# Patient Record
Sex: Male | Born: 1970 | Race: Black or African American | Hispanic: No | Marital: Single | Smoking: Current every day smoker
Health system: Southern US, Community
[De-identification: ages and names within clinical notes are randomized; demographics above are authoritative.]

## PROBLEM LIST (undated history)

## (undated) DIAGNOSIS — L259 Unspecified contact dermatitis, unspecified cause: Secondary | ICD-10-CM

## (undated) DIAGNOSIS — M1990 Unspecified osteoarthritis, unspecified site: Secondary | ICD-10-CM

## (undated) HISTORY — DX: Unspecified contact dermatitis, unspecified cause: L25.9

## (undated) HISTORY — PX: HERNIA REPAIR: SHX51

---

## 2008-10-05 ENCOUNTER — Emergency Department (HOSPITAL_COMMUNITY): Admission: EM | Admit: 2008-10-05 | Discharge: 2008-10-06 | Payer: Self-pay | Admitting: Emergency Medicine

## 2008-10-13 ENCOUNTER — Emergency Department (HOSPITAL_COMMUNITY): Admission: EM | Admit: 2008-10-13 | Discharge: 2008-10-13 | Payer: Self-pay | Admitting: Emergency Medicine

## 2009-01-15 ENCOUNTER — Emergency Department (HOSPITAL_COMMUNITY): Admission: EM | Admit: 2009-01-15 | Discharge: 2009-01-15 | Payer: Self-pay | Admitting: Emergency Medicine

## 2009-03-10 ENCOUNTER — Emergency Department (HOSPITAL_COMMUNITY): Admission: EM | Admit: 2009-03-10 | Discharge: 2009-03-10 | Payer: Self-pay | Admitting: Emergency Medicine

## 2009-05-22 ENCOUNTER — Emergency Department (HOSPITAL_COMMUNITY): Admission: EM | Admit: 2009-05-22 | Discharge: 2009-05-22 | Payer: Self-pay | Admitting: Emergency Medicine

## 2009-07-22 ENCOUNTER — Emergency Department (HOSPITAL_COMMUNITY): Admission: EM | Admit: 2009-07-22 | Discharge: 2009-07-22 | Payer: Self-pay | Admitting: Emergency Medicine

## 2009-11-27 ENCOUNTER — Ambulatory Visit: Payer: Self-pay | Admitting: Family Medicine

## 2009-12-07 ENCOUNTER — Ambulatory Visit (HOSPITAL_COMMUNITY): Admission: RE | Admit: 2009-12-07 | Discharge: 2009-12-07 | Payer: Self-pay | Admitting: Family Medicine

## 2009-12-08 ENCOUNTER — Encounter (INDEPENDENT_AMBULATORY_CARE_PROVIDER_SITE_OTHER): Payer: Self-pay | Admitting: Family Medicine

## 2009-12-08 ENCOUNTER — Ambulatory Visit: Payer: Self-pay | Admitting: Internal Medicine

## 2009-12-08 LAB — CONVERTED CEMR LAB
ALT: 16 units/L (ref 0–53)
AST: 27 units/L (ref 0–37)
Albumin: 4.6 g/dL (ref 3.5–5.2)
Alkaline Phosphatase: 49 units/L (ref 39–117)
BUN: 13 mg/dL (ref 6–23)
Basophils Absolute: 0 10*3/uL (ref 0.0–0.1)
Basophils Relative: 0 % (ref 0–1)
CO2: 25 meq/L (ref 19–32)
Calcium: 9.5 mg/dL (ref 8.4–10.5)
Chloride: 102 meq/L (ref 96–112)
Cholesterol: 187 mg/dL (ref 0–200)
Creatinine, Ser: 1.46 mg/dL (ref 0.40–1.50)
Eosinophils Absolute: 0.2 10*3/uL (ref 0.0–0.7)
Eosinophils Relative: 1 % (ref 0–5)
Glucose, Bld: 95 mg/dL (ref 70–99)
HCT: 43.2 % (ref 39.0–52.0)
HDL: 87 mg/dL (ref >39)
Hemoglobin: 13.8 g/dL (ref 13.0–17.0)
LDL Cholesterol: 92 mg/dL (ref 0–99)
Lymphocytes Relative: 16 % (ref 12–46)
Lymphs Abs: 2.7 10*3/uL (ref 0.7–4.0)
MCHC: 31.9 g/dL (ref 30.0–36.0)
MCV: 100.7 fL — ABNORMAL HIGH (ref 78.0–100.0)
Monocytes Absolute: 0.9 10*3/uL (ref 0.1–1.0)
Monocytes Relative: 5 % (ref 3–12)
Neutro Abs: 13 10*3/uL — ABNORMAL HIGH (ref 1.7–7.7)
Neutrophils Relative %: 77 % (ref 43–77)
Platelets: 120 10*3/uL — ABNORMAL LOW (ref 150–400)
Potassium: 4.3 meq/L (ref 3.5–5.3)
RBC: 4.29 M/uL (ref 4.22–5.81)
RDW: 12.9 % (ref 11.5–15.5)
Sed Rate: 1 mm/hr (ref 0–16)
Sodium: 138 meq/L (ref 135–145)
Total Bilirubin: 1 mg/dL (ref 0.3–1.2)
Total CHOL/HDL Ratio: 2.1
Total Protein: 6.9 g/dL (ref 6.0–8.3)
Triglycerides: 41 mg/dL (ref <150)
VLDL: 8 mg/dL (ref 0–40)
WBC: 16.8 10*3/uL — ABNORMAL HIGH (ref 4.0–10.5)

## 2009-12-19 ENCOUNTER — Ambulatory Visit: Payer: Self-pay | Admitting: Oncology

## 2010-01-11 LAB — MORPHOLOGY: PLT EST: ADEQUATE

## 2010-01-11 LAB — CBC WITH DIFFERENTIAL/PLATELET
BASO%: 0.4 % (ref 0.0–2.0)
Basophils Absolute: 0 10*3/uL (ref 0.0–0.1)
EOS%: 2.8 % (ref 0.0–7.0)
Eosinophils Absolute: 0.3 10*3/uL (ref 0.0–0.5)
HCT: 40.3 % (ref 38.4–49.9)
HGB: 13.4 g/dL (ref 13.0–17.1)
LYMPH%: 26 % (ref 14.0–49.0)
MCH: 32.8 pg (ref 27.2–33.4)
MCHC: 33.3 g/dL (ref 32.0–36.0)
MCV: 98.5 fL — ABNORMAL HIGH (ref 79.3–98.0)
MONO#: 0.7 10*3/uL (ref 0.1–0.9)
MONO%: 7.2 % (ref 0.0–14.0)
NEUT#: 5.9 10*3/uL (ref 1.5–6.5)
NEUT%: 63.6 % (ref 39.0–75.0)
Platelets: 200 10*3/uL (ref 140–400)
RBC: 4.09 10*6/uL — ABNORMAL LOW (ref 4.20–5.82)
RDW: 12.6 % (ref 11.0–14.6)
WBC: 9.3 10*3/uL (ref 4.0–10.3)
lymph#: 2.4 10*3/uL (ref 0.9–3.3)
nRBC: 0 % (ref 0–0)

## 2010-01-11 LAB — CHCC SMEAR

## 2010-01-11 LAB — HIV ANTIBODY (ROUTINE TESTING W REFLEX)

## 2010-01-12 LAB — SEDIMENTATION RATE: Sed Rate: 2 mm/hr (ref 0–16)

## 2010-01-12 LAB — COMPREHENSIVE METABOLIC PANEL
ALT: 13 U/L (ref 0–53)
AST: 22 U/L (ref 0–37)
Albumin: 4.6 g/dL (ref 3.5–5.2)
Alkaline Phosphatase: 50 U/L (ref 39–117)
BUN: 16 mg/dL (ref 6–23)
CO2: 22 mEq/L (ref 19–32)
Calcium: 9.5 mg/dL (ref 8.4–10.5)
Chloride: 105 mEq/L (ref 96–112)
Creatinine, Ser: 1.42 mg/dL (ref 0.40–1.50)
Glucose, Bld: 95 mg/dL (ref 70–99)
Potassium: 4.6 mEq/L (ref 3.5–5.3)
Sodium: 137 mEq/L (ref 135–145)
Total Bilirubin: 0.4 mg/dL (ref 0.3–1.2)
Total Protein: 6.9 g/dL (ref 6.0–8.3)

## 2010-01-12 LAB — LACTATE DEHYDROGENASE: LDH: 138 U/L (ref 94–250)

## 2010-01-12 LAB — ANA: Anti Nuclear Antibody(ANA): NEGATIVE

## 2010-01-31 ENCOUNTER — Ambulatory Visit: Payer: Self-pay | Admitting: Oncology

## 2010-02-01 ENCOUNTER — Ambulatory Visit (HOSPITAL_COMMUNITY): Admission: RE | Admit: 2010-02-01 | Discharge: 2010-02-01 | Payer: Self-pay | Admitting: Oncology

## 2010-02-23 LAB — CBC WITH DIFFERENTIAL/PLATELET
BASO%: 0.2 % (ref 0.0–2.0)
Basophils Absolute: 0 10*3/uL (ref 0.0–0.1)
EOS%: 4.8 % (ref 0.0–7.0)
Eosinophils Absolute: 0.5 10*3/uL (ref 0.0–0.5)
HCT: 37 % — ABNORMAL LOW (ref 38.4–49.9)
HGB: 12.4 g/dL — ABNORMAL LOW (ref 13.0–17.1)
LYMPH%: 27.8 % (ref 14.0–49.0)
MCH: 32.3 pg (ref 27.2–33.4)
MCHC: 33.5 g/dL (ref 32.0–36.0)
MCV: 96.4 fL (ref 79.3–98.0)
MONO#: 0.7 10*3/uL (ref 0.1–0.9)
MONO%: 7 % (ref 0.0–14.0)
NEUT#: 6 10*3/uL (ref 1.5–6.5)
NEUT%: 60.2 % (ref 39.0–75.0)
Platelets: 124 10*3/uL — ABNORMAL LOW (ref 140–400)
RBC: 3.84 10*6/uL — ABNORMAL LOW (ref 4.20–5.82)
RDW: 12.4 % (ref 11.0–14.6)
WBC: 9.9 10*3/uL (ref 4.0–10.3)
lymph#: 2.7 10*3/uL (ref 0.9–3.3)
nRBC: 0 % (ref 0–0)

## 2010-02-23 LAB — COMPREHENSIVE METABOLIC PANEL
ALT: 59 U/L — ABNORMAL HIGH (ref 0–53)
AST: 215 U/L — ABNORMAL HIGH (ref 0–37)
Albumin: 4.4 g/dL (ref 3.5–5.2)
Alkaline Phosphatase: 43 U/L (ref 39–117)
BUN: 15 mg/dL (ref 6–23)
CO2: 27 mEq/L (ref 19–32)
Calcium: 9.3 mg/dL (ref 8.4–10.5)
Chloride: 102 mEq/L (ref 96–112)
Creatinine, Ser: 1.12 mg/dL (ref 0.40–1.50)
Glucose, Bld: 112 mg/dL — ABNORMAL HIGH (ref 70–99)
Potassium: 5.5 mEq/L — ABNORMAL HIGH (ref 3.5–5.3)
Sodium: 139 mEq/L (ref 135–145)
Total Bilirubin: 0.4 mg/dL (ref 0.3–1.2)
Total Protein: 6.8 g/dL (ref 6.0–8.3)

## 2010-02-23 LAB — LACTATE DEHYDROGENASE: LDH: 873 U/L — ABNORMAL HIGH (ref 94–250)

## 2010-10-21 ENCOUNTER — Encounter: Payer: Self-pay | Admitting: Family Medicine

## 2011-08-23 ENCOUNTER — Emergency Department (INDEPENDENT_AMBULATORY_CARE_PROVIDER_SITE_OTHER)
Admission: EM | Admit: 2011-08-23 | Discharge: 2011-08-23 | Disposition: A | Discharge status: Home / Self Care | Payer: Self-pay | Source: Home / Self Care | Attending: Family Medicine | Admitting: Family Medicine

## 2011-08-23 DIAGNOSIS — Z202 Contact with and (suspected) exposure to infections with a predominantly sexual mode of transmission: Secondary | ICD-10-CM

## 2011-08-23 LAB — RPR: RPR Ser Ql: NONREACTIVE

## 2011-08-23 LAB — HIV ANTIBODY (ROUTINE TESTING W REFLEX): HIV: NONREACTIVE

## 2011-08-23 MED ORDER — AZITHROMYCIN 250 MG PO TABS
ORAL_TABLET | ORAL | Status: AC
  Filled 2011-08-23: qty 4

## 2011-08-23 MED ORDER — CEFTRIAXONE SODIUM 250 MG IJ SOLR
INTRAMUSCULAR | Status: AC
  Filled 2011-08-23: qty 250

## 2011-08-23 MED ORDER — LIDOCAINE HCL (PF) 1 % IJ SOLN
INTRAMUSCULAR | Status: AC
  Filled 2011-08-23: qty 5

## 2011-08-23 MED ORDER — AZITHROMYCIN 250 MG PO TABS
1000.0000 mg | ORAL_TABLET | Freq: Once | ORAL | Status: AC
  Administered 2011-08-23: 1000 mg via ORAL

## 2011-08-23 MED ORDER — CEFTRIAXONE SODIUM 250 MG IJ SOLR
250.0000 mg | Freq: Once | INTRAMUSCULAR | Status: AC
  Administered 2011-08-23: 250 mg via INTRAMUSCULAR

## 2011-08-23 NOTE — ED Provider Notes (Signed)
Medical screening examination/treatment/procedure(s) were performed by non-physician practitioner and as supervising physician I was immediately available for consultation/collaboration.  Corrie Mckusick, MD 08/23/11 1630

## 2011-08-23 NOTE — ED Notes (Signed)
States he  Was informed by partner that she has chlaymidia; NAD; want to be tested for all STD's

## 2011-08-23 NOTE — ED Provider Notes (Signed)
History     CSN: [id number] Arrival date & time: 08/23/2011 10:33 AM   First MD Initiated Contact with Patient 08/23/11 1020      Chief Complaint  Patient presents with  . Exposure to STD    (Consider location/radiation/quality/duration/timing/severity/associated sxs/prior treatment) HPI Comments: Pt reports his fiance recently dx with chlamydia. Wants tx and testing for STDs.  Denies any sx or c/o  Patient is a 40 y.o. male presenting with STD exposure. The history is provided by the patient.  Exposure to STD This is a new problem. The problem has not changed since onset.Pertinent negatives include no abdominal pain. The symptoms are aggravated by nothing.    History reviewed. No pertinent past medical history.  History reviewed. No pertinent past surgical history.  History reviewed. No pertinent family history.  History  Substance Use Topics  . Smoking status: Not on file  . Smokeless tobacco: Not on file  . Alcohol Use: Not on file      Review of Systems  Constitutional: Negative for fever and chills.  Gastrointestinal: Negative for abdominal pain.  Genitourinary: Negative for flank pain, discharge, difficulty urinating, genital sores and penile pain.    Allergies  Review of patient's allergies indicates no known allergies.  Home Medications  No current outpatient prescriptions on file.  BP 133/68  Pulse 78  Temp(Src) 98.4 F (36.9 C) (Oral)  Resp 10  Physical Exam  Constitutional: He appears well-developed and well-nourished.  Cardiovascular: Normal rate and regular rhythm.   Pulmonary/Chest: Effort normal and breath sounds normal.  Abdominal: Soft. Bowel sounds are normal. He exhibits no distension. There is no tenderness.  Genitourinary:       GU exam deferred as pt denies sx and will be receiving tx today for GC and chlamydia    ED Course  Procedures (including critical care time)   Labs Reviewed  RPR  HIV ANTIBODY (ROUTINE TESTING)   No  results found.   No diagnosis found.    MDM  Deferred GU exam since will be tx for GC and chlamydia today.  Pt knows to avoid intercourse or use condoms for 7 days after tx to prevent recurrence.         Cathlyn Parsons, NP 08/23/11 1121

## 2011-08-26 ENCOUNTER — Telehealth (HOSPITAL_COMMUNITY): Payer: Self-pay | Admitting: *Deleted

## 2011-08-26 NOTE — ED Notes (Signed)
Pt came in today with picture ID to get HIV results.  Pt given negative results for HIV and RPR.  Pt did not have any questions, wanted copy of report.  Had patient speak with Medical Records clerk, Maxine Glenn and she is getting that for him.

## 2011-09-04 ENCOUNTER — Encounter (HOSPITAL_COMMUNITY): Payer: Self-pay | Admitting: Emergency Medicine

## 2011-09-04 ENCOUNTER — Emergency Department (HOSPITAL_COMMUNITY)
Admission: EM | Admit: 2011-09-04 | Discharge: 2011-09-05 | Disposition: A | Discharge status: Home / Self Care | Payer: Self-pay | Attending: Emergency Medicine | Admitting: Emergency Medicine

## 2011-09-04 DIAGNOSIS — F1910 Other psychoactive substance abuse, uncomplicated: Secondary | ICD-10-CM | POA: Insufficient documentation

## 2011-09-04 DIAGNOSIS — F17200 Nicotine dependence, unspecified, uncomplicated: Secondary | ICD-10-CM | POA: Insufficient documentation

## 2011-09-04 DIAGNOSIS — F1010 Alcohol abuse, uncomplicated: Secondary | ICD-10-CM | POA: Insufficient documentation

## 2011-09-04 HISTORY — DX: Unspecified osteoarthritis, unspecified site: M19.90

## 2011-09-04 LAB — DIFFERENTIAL
Basophils Absolute: 0 10*3/uL (ref 0.0–0.1)
Basophils Relative: 0 % (ref 0–1)
Eosinophils Absolute: 0.2 10*3/uL (ref 0.0–0.7)
Eosinophils Relative: 2 % (ref 0–5)
Lymphocytes Relative: 37 % (ref 12–46)
Lymphs Abs: 4.3 10*3/uL — ABNORMAL HIGH (ref 0.7–4.0)
Monocytes Absolute: 0.9 10*3/uL (ref 0.1–1.0)
Monocytes Relative: 8 % (ref 3–12)
Neutro Abs: 6.2 10*3/uL (ref 1.7–7.7)
Neutrophils Relative %: 53 % (ref 43–77)

## 2011-09-04 LAB — CBC
HCT: 39.5 % (ref 39.0–52.0)
Hemoglobin: 13.2 g/dL (ref 13.0–17.0)
MCH: 32.4 pg (ref 26.0–34.0)
MCHC: 33.4 g/dL (ref 30.0–36.0)
MCV: 96.8 fL (ref 78.0–100.0)
Platelets: 162 10*3/uL (ref 150–400)
RBC: 4.08 MIL/uL — ABNORMAL LOW (ref 4.22–5.81)
RDW: 13 % (ref 11.5–15.5)
WBC: 11.6 10*3/uL — ABNORMAL HIGH (ref 4.0–10.5)

## 2011-09-04 LAB — BASIC METABOLIC PANEL
BUN: 15 mg/dL (ref 6–23)
CO2: 27 mEq/L (ref 19–32)
Calcium: 9.2 mg/dL (ref 8.4–10.5)
Chloride: 100 mEq/L (ref 96–112)
Creatinine, Ser: 1.17 mg/dL (ref 0.50–1.35)
GFR calc Af Amer: 88 mL/min — ABNORMAL LOW (ref >90)
GFR calc non Af Amer: 76 mL/min — ABNORMAL LOW (ref >90)
Glucose, Bld: 90 mg/dL (ref 70–99)
Potassium: 3.9 mEq/L (ref 3.5–5.1)
Sodium: 135 mEq/L (ref 135–145)

## 2011-09-04 LAB — RAPID URINE DRUG SCREEN, HOSP PERFORMED
Amphetamines: NOT DETECTED
Barbiturates: NOT DETECTED
Benzodiazepines: NOT DETECTED
Cocaine: POSITIVE — AB
Opiates: NOT DETECTED
Tetrahydrocannabinol: POSITIVE — AB

## 2011-09-04 LAB — ETHANOL: Alcohol, Ethyl (B): <11 mg/dL (ref 0–11)

## 2011-09-04 MED ORDER — ACETAMINOPHEN 325 MG PO TABS: 650.0000 mg | ORAL_TABLET | ORAL | Status: DC | PRN

## 2011-09-04 MED ORDER — ALUM & MAG HYDROXIDE-SIMETH 200-200-20 MG/5ML PO SUSP: 30.0000 mL | ORAL | Status: DC | PRN

## 2011-09-04 MED ORDER — ZOLPIDEM TARTRATE 5 MG PO TABS: 5.0000 mg | ORAL_TABLET | Freq: Every evening | ORAL | Status: DC | PRN

## 2011-09-04 MED ORDER — NICOTINE 21 MG/24HR TD PT24: 21.0000 mg | MEDICATED_PATCH | Freq: Every day | TRANSDERMAL | Status: DC

## 2011-09-04 MED ORDER — ONDANSETRON HCL 4 MG PO TABS: 4.0000 mg | ORAL_TABLET | Freq: Three times a day (TID) | ORAL | Status: DC | PRN

## 2011-09-04 MED ORDER — LORAZEPAM 1 MG PO TABS: 1.0000 mg | ORAL_TABLET | Freq: Three times a day (TID) | ORAL | Status: DC | PRN

## 2011-09-04 MED ORDER — IBUPROFEN 600 MG PO TABS: 600.0000 mg | ORAL_TABLET | Freq: Three times a day (TID) | ORAL | Status: DC | PRN

## 2011-09-04 NOTE — ED Provider Notes (Signed)
History     CSN: [id number] Arrival date & time: 09/04/2011  3:13 PM   First MD Initiated Contact with Patient 09/04/11 1720      Chief Complaint  Patient presents with  . Medical Clearance    (Consider location/radiation/quality/duration/timing/severity/associated sxs/prior treatment) HPI  Patient presents to ER requesting detox from alcohol, crack cocaine, marijuana stating that he has been using all three for approximately 25 years with a 10 year hx of drinking approximately 8-9 forty ounce beers a day. Patient has not attempted to quit substances on his own in the past and therefore no hx of alcohol withdrawal seizures or DTs. Patient has no physical complaint at this time. States he feels mild depression at times but denies SI or HI ideation. Denies aggravating or alleviating factors.   Past Medical History  Diagnosis Date  . Arthritis     History reviewed. No pertinent past surgical history.  No family history on file.  History  Substance Use Topics  . Smoking status: Current Everyday Smoker -- 10 years    Types: Cigarettes  . Smokeless tobacco: Not on file  . Alcohol Use:      multiple drinks daily       Review of Systems  All other systems reviewed and are negative.    Allergies  Review of patient's allergies indicates no known allergies.  Home Medications  No current outpatient prescriptions on file.  BP 132/66  Pulse 88  Temp(Src) 98.6 F (37 C) (Oral)  Resp 18  SpO2 99%  Physical Exam  Nursing note and vitals reviewed. Constitutional: He is oriented to person, place, and time. He appears well-developed and well-nourished. No distress.  HENT:  Head: Normocephalic and atraumatic.  Eyes: Conjunctivae and EOM are normal. Pupils are equal, round, and reactive to light.  Neck: Normal range of motion. Neck supple.  Cardiovascular: Normal rate, regular rhythm, normal heart sounds and intact distal pulses.  Exam reveals no gallop and no friction rub.     No murmur heard. Pulmonary/Chest: Effort normal and breath sounds normal. No respiratory distress. He has no wheezes. He has no rales. He exhibits no tenderness.  Abdominal: Bowel sounds are normal. He exhibits no distension and no mass. There is no tenderness. There is no rebound and no guarding.  Musculoskeletal: Normal range of motion. He exhibits no edema and no tenderness.  Neurological: He is alert and oriented to person, place, and time.  Skin: Skin is warm and dry. No rash noted. He is not diaphoretic. No erythema.  Psychiatric: He has a normal mood and affect.    ED Course  Procedures (including critical care time)  pysch holding orders written.   Pending ACT team consult.  Labs Reviewed  BASIC METABOLIC PANEL - Abnormal; Notable for the following:    GFR calc non Af Amer 76 (*)    GFR calc Af Amer 88 (*)    All other components within normal limits  CBC - Abnormal; Notable for the following:    WBC 11.6 (*)    RBC 4.08 (*)    All other components within normal limits  DIFFERENTIAL - Abnormal; Notable for the following:    Lymphs Abs 4.3 (*)    All other components within normal limits  URINE RAPID DRUG SCREEN (HOSP PERFORMED) - Abnormal; Notable for the following:    Cocaine POSITIVE (*)    Tetrahydrocannabinol POSITIVE (*)    All other components within normal limits  ETHANOL   No results found.  1. Alcohol abuse   2. Drug abuse       MDM          Jenness Corner, [place] 09/04/11 2257

## 2011-09-04 NOTE — ED Notes (Signed)
Pt has been cleared for discharge after signing a no harm contract with the ACT team. Pt states he cannot leave tonight because he lives with his elderly parents (63 and 50) and he will not be able to get them to the door to let him in in the middle of the night. ACT team and MD aware of delay in discharge. Pt remains stable and safe.

## 2011-09-04 NOTE — ED Notes (Signed)
Pt states that he wants detox from drugs and alcohol states that he has been doing cocaine and liquior for many years now, denies any si or hi
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
## 2011-09-04 NOTE — ED Notes (Signed)
Family at bedside. 

## 2011-09-04 NOTE — ED Notes (Signed)
Pt. Belongings sent with wife

## 2011-09-04 NOTE — ED Notes (Addendum)
Pt presents to Psych ED with wife present requesting detox from "alcohol and cocaine". Pt ETOH<11. Presently pt is in his room sleeping, calm and cooperative with questions. Denies needs, remains safe in room.

## 2011-09-04 NOTE — ED Notes (Signed)
Pt requesting ETOH detox, no s/i or h/i expressed, pt calm and cooperative during triage

## 2011-09-05 NOTE — ED Notes (Signed)
Pt. Called parent to pick him up, parent to arrive at 1030.

## 2011-09-05 NOTE — ED Notes (Signed)
Per pt. Request, washcloth, toothpaste, toothbrush given.

## 2011-09-05 NOTE — ED Notes (Signed)
Per night RN report, pt. Wanted to leave this morning not last night r/t parents in their 41's and didn't want to wake them to come and get him, transportation assistance offered and declined by pt, per night  RN.  Plan to d/c pt. After bkft.

## 2011-09-05 NOTE — ED Provider Notes (Signed)
Medical screening examination/treatment/procedure(s) were performed by non-physician practitioner and as supervising physician I was immediately available for consultation/collaboration.   Deforest Maiden A Fredis Malkiewicz, MD 09/05/11 1317 

## 2011-09-20 ENCOUNTER — Encounter (HOSPITAL_COMMUNITY): Payer: Self-pay

## 2011-09-20 ENCOUNTER — Emergency Department (INDEPENDENT_AMBULATORY_CARE_PROVIDER_SITE_OTHER)
Admission: EM | Admit: 2011-09-20 | Discharge: 2011-09-20 | Disposition: A | Discharge status: Home / Self Care | Payer: Self-pay | Source: Home / Self Care | Attending: Emergency Medicine | Admitting: Emergency Medicine

## 2011-09-20 DIAGNOSIS — T300 Burn of unspecified body region, unspecified degree: Secondary | ICD-10-CM

## 2011-09-20 MED ORDER — "TELFA NON-ADHERENT 2""X3"" PADS": 1.0000 "application " | MEDICATED_PAD | Status: DC

## 2011-09-20 MED ORDER — SILVER SULFADIAZINE 1 % EX CREA
TOPICAL_CREAM | Freq: Once | CUTANEOUS | Status: AC
  Administered 2011-09-20: 09:00:00 via TOPICAL

## 2011-09-20 MED ORDER — SILVER SULFADIAZINE 1 % EX CREA: TOPICAL_CREAM | Freq: Every day | CUTANEOUS | Status: AC

## 2011-09-20 MED ORDER — HYDROCODONE-ACETAMINOPHEN 5-325 MG PO TABS: 2.0000 | ORAL_TABLET | ORAL | Status: AC | PRN
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
## 2013-02-24 ENCOUNTER — Emergency Department (HOSPITAL_COMMUNITY): Payer: Self-pay

## 2013-02-24 ENCOUNTER — Emergency Department (HOSPITAL_COMMUNITY)
Admission: EM | Admit: 2013-02-24 | Discharge: 2013-02-24 | Disposition: A | Discharge status: Home / Self Care | Payer: Self-pay | Attending: Emergency Medicine | Admitting: Emergency Medicine

## 2013-02-24 ENCOUNTER — Encounter (HOSPITAL_COMMUNITY): Payer: Self-pay | Admitting: Emergency Medicine

## 2013-02-24 DIAGNOSIS — F17200 Nicotine dependence, unspecified, uncomplicated: Secondary | ICD-10-CM | POA: Insufficient documentation

## 2013-02-24 DIAGNOSIS — M545 Low back pain, unspecified: Secondary | ICD-10-CM | POA: Insufficient documentation

## 2013-02-24 DIAGNOSIS — G8929 Other chronic pain: Secondary | ICD-10-CM | POA: Insufficient documentation

## 2013-02-24 DIAGNOSIS — M549 Dorsalgia, unspecified: Secondary | ICD-10-CM

## 2013-02-24 DIAGNOSIS — Z8739 Personal history of other diseases of the musculoskeletal system and connective tissue: Secondary | ICD-10-CM | POA: Insufficient documentation

## 2013-02-24 MED ORDER — OXYCODONE-ACETAMINOPHEN 5-325 MG PO TABS: 1.0000 | ORAL_TABLET | ORAL | Status: DC | PRN

## 2013-02-24 MED ORDER — METHOCARBAMOL 500 MG PO TABS: 500.0000 mg | ORAL_TABLET | Freq: Two times a day (BID) | ORAL | Status: DC

## 2013-02-24 MED ORDER — IBUPROFEN 800 MG PO TABS: 800.0000 mg | ORAL_TABLET | Freq: Three times a day (TID) | ORAL | Status: DC

## 2013-02-24 MED ORDER — OXYCODONE-ACETAMINOPHEN 5-325 MG PO TABS
1.0000 | ORAL_TABLET | Freq: Once | ORAL | Status: AC
  Administered 2013-02-24: 1 via ORAL
  Filled 2013-02-24: qty 1

## 2013-02-24 MED ORDER — KETOROLAC TROMETHAMINE 60 MG/2ML IM SOLN
60.0000 mg | Freq: Once | INTRAMUSCULAR | Status: AC
  Administered 2013-02-24: 60 mg via INTRAMUSCULAR
  Filled 2013-02-24: qty 2

## 2013-02-24 NOTE — ED Provider Notes (Signed)
History     CSN: [id number]  Arrival date & time 02/24/13  0901   First MD Initiated Contact with Patient 02/24/13 1005      Chief Complaint  Patient presents with  . Back Pain    (Consider location/radiation/quality/duration/timing/severity/associated sxs/prior treatment) HPI Donald Bentley is a 42 y.o. male who presents to ED with complaint of lower back pain. 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 No particular injuries. No pain radiation. Pain is 8/10. Flared up 2 days ago. States pain worse with movement. No fever. No iv drug use. No abdominal pain. No urinary symptoms. No numbness or weakness in extremities. States did not take anything in the last few days, states "over the counter stuff dont work." Does not have PCP.    Past Medical History  Diagnosis Date  . Arthritis     History reviewed. No pertinent past surgical history.  No family history on file.  History  Substance Use Topics  . Smoking status: Current Every Day Smoker -- 10 years    Types: Cigarettes  . Smokeless tobacco: Not on file  . Alcohol Use: Yes     Comment: multiple drinks daily       Review of Systems  Constitutional: Negative for fever and chills.  HENT: Negative for neck pain and neck stiffness.   Respiratory: Negative.   Cardiovascular: Negative.   Gastrointestinal: Negative for abdominal pain.  Genitourinary: Negative for dysuria and flank pain.  Musculoskeletal: Positive for back pain. Negative for gait problem.  Skin: Negative.   Neurological: Negative for weakness and numbness.    Allergies  Review of patient's allergies indicates no known allergies.  Home Medications  No current outpatient prescriptions on file.  BP 120/67  Pulse 82  Temp(Src) 98 F (36.7 C) (Oral)  Resp 18  SpO2 98%  Physical Exam  Nursing note and vitals reviewed. Constitutional: He appears well-developed and well-nourished. No distress.   Eyes: Conjunctivae are normal.  Neck: Neck supple.  Cardiovascular: Normal rate, regular rhythm and normal heart sounds.   Pulmonary/Chest: Effort normal and breath sounds normal. No respiratory distress. He has no wheezes. He has no rales.  Abdominal: Soft. There is no tenderness.  Musculoskeletal:  Midline and perivertebral lumbar spine tenderness. Negative straight leg raise test.   Neurological:  5/5 and equal LE strength bilaterally. 2+ patellar reflexes bilaterally. Pt able to dorsiflex bilateral feet and toes  Skin: Skin is warm and dry.    ED Course  Procedures (including critical care time)  Labs Reviewed - No data to display No results found.  Dg Lumbar Spine Complete  02/24/2013   *RADIOLOGY REPORT*  Clinical Data: Low back pain  LUMBAR SPINE - COMPLETE 4+ VIEW  Comparison: None.  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 Vertebral body height is well-maintained.  No spondylolysis or spondylolisthesis is seen.  Mild disc space narrowing is noted at the lumbosacral junction.  IMPRESSION: Four non-rib bearing lumbar vertebra.  Mild degenerative change.   Original Report Authenticated By: Alcide Clever, M.D.     1. Back pain       MDM  Pt with chronic back pain exacerbation. No red flags suggesting cauda equina. No new injuries. No abdominal pain or neuro deficits. X-ray showing mild degenerative changes, otherwise normal. 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 Given toradol 60mg  IM and percocet 1 tab PO in ED. Home with 20 percocets, 20 robaxin, ibuprofen. Instructed  to follow up with pcp.   Filed Vitals:   02/24/13 0906  BP: 120/67  Pulse: 82  Temp: 98 F (36.7 C)  TempSrc: Oral  Resp: 18  SpO2: 98%           Tarisha Fader A Nickalaus Crooke, PA-C 02/24/13 1157

## 2013-02-24 NOTE — ED Notes (Signed)
Pt c/o low back pain. Denies recent injury, reports ongoing for years. This episode started last week. Pt has not had anything for pain today.

## 2013-02-26 NOTE — ED Provider Notes (Signed)
Medical screening examination/treatment/procedure(s) were performed by non-physician practitioner and as supervising physician I was immediately available for consultation/collaboration.   Lavaeh Bau H Lyal Husted, MD 02/26/13 1504 

## 2015-03-16 ENCOUNTER — Emergency Department (HOSPITAL_COMMUNITY): Payer: Self-pay

## 2015-03-16 ENCOUNTER — Emergency Department (HOSPITAL_COMMUNITY)
Admission: EM | Admit: 2015-03-16 | Discharge: 2015-03-16 | Disposition: A | Discharge status: Home / Self Care | Payer: Self-pay | Attending: Emergency Medicine | Admitting: Emergency Medicine

## 2015-03-16 ENCOUNTER — Encounter (HOSPITAL_COMMUNITY): Payer: Self-pay | Admitting: Emergency Medicine

## 2015-03-16 DIAGNOSIS — Y9289 Other specified places as the place of occurrence of the external cause: Secondary | ICD-10-CM | POA: Insufficient documentation

## 2015-03-16 DIAGNOSIS — M1990 Unspecified osteoarthritis, unspecified site: Secondary | ICD-10-CM | POA: Insufficient documentation

## 2015-03-16 DIAGNOSIS — S0083XA Contusion of other part of head, initial encounter: Secondary | ICD-10-CM

## 2015-03-16 DIAGNOSIS — Z720 Tobacco use: Secondary | ICD-10-CM | POA: Insufficient documentation

## 2015-03-16 DIAGNOSIS — Y9389 Activity, other specified: Secondary | ICD-10-CM | POA: Insufficient documentation

## 2015-03-16 DIAGNOSIS — Y998 Other external cause status: Secondary | ICD-10-CM | POA: Insufficient documentation

## 2015-03-16 DIAGNOSIS — S0511XA Contusion of eyeball and orbital tissues, right eye, initial encounter: Secondary | ICD-10-CM | POA: Insufficient documentation

## 2015-03-16 MED ORDER — IBUPROFEN 800 MG PO TABS: 800.0000 mg | ORAL_TABLET | Freq: Three times a day (TID) | ORAL | Status: DC

## 2015-03-16 MED ORDER — HYDROCODONE-ACETAMINOPHEN 5-325 MG PO TABS
1.0000 | ORAL_TABLET | Freq: Once | ORAL | Status: AC
  Administered 2015-03-16: 1 via ORAL
  Filled 2015-03-16: qty 1

## 2015-03-16 NOTE — Discharge Instructions (Signed)
Contusion °A contusion is a deep bruise. Contusions are the result of an injury that caused bleeding under the skin. The contusion may turn blue, purple, or yellow. Minor injuries will give you a painless contusion, but more severe contusions may stay painful and swollen for a few weeks.  °CAUSES  °A contusion is usually caused by a blow, trauma, or direct force to an area of the body. °SYMPTOMS  °· Swelling and redness of the injured area. °· Bruising of the injured area. °· Tenderness and soreness of the injured area. °· Pain. °DIAGNOSIS  °The diagnosis can be made by taking a history and physical exam. An X-ray, CT scan, or MRI may be needed to determine if there were any associated injuries, such as fractures. °TREATMENT  °Specific treatment will depend on what area of the body was injured. In general, the best treatment for a contusion is resting, icing, elevating, and applying cold compresses to the injured area. Over-the-counter medicines may also be recommended for pain control. Ask your caregiver what the best treatment is for your contusion. °HOME CARE INSTRUCTIONS  °· Put ice on the injured area. °¨ Put ice in a plastic bag. °¨ Place a towel between your skin and the bag. °¨ Leave the ice on for 15-20 minutes, 3-4 times a day, or as directed by your health care provider. °· Only take over-the-counter or prescription medicines for pain, discomfort, or fever as directed by your caregiver. Your caregiver may recommend avoiding anti-inflammatory medicines (aspirin, ibuprofen, and naproxen) for 48 hours because these medicines may increase bruising. °· Rest the injured area. °· If possible, elevate the injured area to reduce swelling. °SEEK IMMEDIATE MEDICAL CARE IF:  °· You have increased bruising or swelling. °· You have pain that is getting worse. °· Your swelling or pain is not relieved with medicines. °MAKE SURE YOU:  °· Understand these instructions. °· Will watch your condition. °· Will get help right  away if you are not doing well or get worse. °Document Released: 06/26/2005 Document Revised: 09/21/2013 Document Reviewed: 07/22/2011 °[employer]® Patient Information ©2015 [employer]. This information is not intended to replace advice given to you by your health care provider. Make sure you discuss any questions you have with your health care provider. ° °
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
## 2015-04-04 ENCOUNTER — Emergency Department (HOSPITAL_COMMUNITY)
Admission: EM | Admit: 2015-04-04 | Discharge: 2015-04-05 | Disposition: A | Discharge status: Home / Self Care | Payer: Self-pay | Attending: Emergency Medicine | Admitting: Emergency Medicine

## 2015-04-04 ENCOUNTER — Encounter (HOSPITAL_COMMUNITY): Payer: Self-pay | Admitting: *Deleted

## 2015-04-04 DIAGNOSIS — M1990 Unspecified osteoarthritis, unspecified site: Secondary | ICD-10-CM | POA: Insufficient documentation

## 2015-04-04 DIAGNOSIS — F1994 Other psychoactive substance use, unspecified with psychoactive substance-induced mood disorder: Secondary | ICD-10-CM | POA: Diagnosis present

## 2015-04-04 DIAGNOSIS — F1410 Cocaine abuse, uncomplicated: Secondary | ICD-10-CM | POA: Insufficient documentation

## 2015-04-04 DIAGNOSIS — F1910 Other psychoactive substance abuse, uncomplicated: Secondary | ICD-10-CM | POA: Diagnosis present

## 2015-04-04 DIAGNOSIS — R440 Auditory hallucinations: Secondary | ICD-10-CM | POA: Diagnosis present

## 2015-04-04 DIAGNOSIS — F329 Major depressive disorder, single episode, unspecified: Secondary | ICD-10-CM | POA: Insufficient documentation

## 2015-04-04 DIAGNOSIS — F32A Depression, unspecified: Secondary | ICD-10-CM | POA: Diagnosis present

## 2015-04-04 DIAGNOSIS — R45850 Homicidal ideations: Secondary | ICD-10-CM

## 2015-04-04 DIAGNOSIS — Z791 Long term (current) use of non-steroidal anti-inflammatories (NSAID): Secondary | ICD-10-CM | POA: Insufficient documentation

## 2015-04-04 DIAGNOSIS — Z720 Tobacco use: Secondary | ICD-10-CM | POA: Insufficient documentation

## 2015-04-04 DIAGNOSIS — R45851 Suicidal ideations: Secondary | ICD-10-CM

## 2015-04-04 LAB — COMPREHENSIVE METABOLIC PANEL
ALT: 19 U/L (ref 17–63)
AST: 39 U/L (ref 15–41)
Albumin: 4.3 g/dL (ref 3.5–5.0)
Alkaline Phosphatase: 55 U/L (ref 38–126)
Anion gap: 11 (ref 5–15)
BUN: 13 mg/dL (ref 6–20)
CO2: 24 mmol/L (ref 22–32)
Calcium: 9 mg/dL (ref 8.9–10.3)
Chloride: 99 mmol/L — ABNORMAL LOW (ref 101–111)
Creatinine, Ser: 1.31 mg/dL — ABNORMAL HIGH (ref 0.61–1.24)
GFR calc Af Amer: >60 mL/min (ref >60)
GFR calc non Af Amer: >60 mL/min (ref >60)
Glucose, Bld: 103 mg/dL — ABNORMAL HIGH (ref 65–99)
Potassium: 3.9 mmol/L (ref 3.5–5.1)
Sodium: 134 mmol/L — ABNORMAL LOW (ref 135–145)
Total Bilirubin: 0.4 mg/dL (ref 0.3–1.2)
Total Protein: 7.9 g/dL (ref 6.5–8.1)

## 2015-04-04 LAB — ACETAMINOPHEN LEVEL: Acetaminophen (Tylenol), Serum: <10 ug/mL — ABNORMAL LOW (ref 10–30)

## 2015-04-04 LAB — CBC WITH DIFFERENTIAL/PLATELET
Basophils Absolute: 0 10*3/uL (ref 0.0–0.1)
Basophils Relative: 0 % (ref 0–1)
Eosinophils Absolute: 0.1 10*3/uL (ref 0.0–0.7)
Eosinophils Relative: 1 % (ref 0–5)
HCT: 38 % — ABNORMAL LOW (ref 39.0–52.0)
Hemoglobin: 12.8 g/dL — ABNORMAL LOW (ref 13.0–17.0)
Lymphocytes Relative: 20 % (ref 12–46)
Lymphs Abs: 2.7 10*3/uL (ref 0.7–4.0)
MCH: 32.6 pg (ref 26.0–34.0)
MCHC: 33.7 g/dL (ref 30.0–36.0)
MCV: 96.7 fL (ref 78.0–100.0)
Monocytes Absolute: 0.6 10*3/uL (ref 0.1–1.0)
Monocytes Relative: 5 % (ref 3–12)
Neutro Abs: 10.3 10*3/uL — ABNORMAL HIGH (ref 1.7–7.7)
Neutrophils Relative %: 74 % (ref 43–77)
Platelets: 183 10*3/uL (ref 150–400)
RBC: 3.93 MIL/uL — ABNORMAL LOW (ref 4.22–5.81)
RDW: 12.9 % (ref 11.5–15.5)
WBC: 13.7 10*3/uL — ABNORMAL HIGH (ref 4.0–10.5)

## 2015-04-04 LAB — RAPID URINE DRUG SCREEN, HOSP PERFORMED
Amphetamines: NOT DETECTED
Barbiturates: NOT DETECTED
Benzodiazepines: NOT DETECTED
Cocaine: POSITIVE — AB
Opiates: NOT DETECTED
Tetrahydrocannabinol: NOT DETECTED

## 2015-04-04 LAB — ETHANOL: Alcohol, Ethyl (B): 41 mg/dL — ABNORMAL HIGH (ref <5)

## 2015-04-04 LAB — SALICYLATE LEVEL: Salicylate Lvl: <4 mg/dL (ref 2.8–30.0)

## 2015-04-04 MED ORDER — IBUPROFEN 400 MG PO TABS: 600.0000 mg | ORAL_TABLET | Freq: Three times a day (TID) | ORAL | Status: DC | PRN

## 2015-04-04 MED ORDER — HYDROCHLOROTHIAZIDE 25 MG PO TABS
25.0000 mg | ORAL_TABLET | Freq: Every day | ORAL | Status: DC
  Filled 2015-04-04: qty 1

## 2015-04-04 MED ORDER — ACETAMINOPHEN 325 MG PO TABS: 650.0000 mg | ORAL_TABLET | ORAL | Status: DC | PRN

## 2015-04-04 MED ORDER — ONDANSETRON HCL 4 MG PO TABS: 4.0000 mg | ORAL_TABLET | Freq: Three times a day (TID) | ORAL | Status: DC | PRN

## 2015-04-04 MED ORDER — LORAZEPAM 1 MG PO TABS: 1.0000 mg | ORAL_TABLET | Freq: Three times a day (TID) | ORAL | Status: DC | PRN

## 2015-04-04 MED ORDER — ALUM & MAG HYDROXIDE-SIMETH 200-200-20 MG/5ML PO SUSP: 30.0000 mL | ORAL | Status: DC | PRN

## 2015-04-04 MED ORDER — ZOLPIDEM TARTRATE 5 MG PO TABS: 5.0000 mg | ORAL_TABLET | Freq: Every evening | ORAL | Status: DC | PRN

## 2015-04-04 NOTE — ED Provider Notes (Signed)
CSN: [id number]     Arrival date & time 04/04/15  1737 History  This chart was scribed for non-physician provider Marlon Peliffany Ruberta Holck, PA-C, working with Mancel BaleElliott Wentz, MD by Phillis HaggisGabriella Gaje, ED Scribe. This patient was seen in room TR02C/TR02C and patient care was started at 6:14 PM.    Chief Complaint  Patient presents with  . Suicidal  . Homicidal   The history is provided by the patient. No language interpreter was used.   HPI Comments: Donald Bentley is a 44 y.o. male with depression who presents to the Emergency Department complaining of SI and HI. Pt states that his father died last month and is now feeling depressed, with HI and SI, but has no specific plan. He reports intermittent depression but it has never been to this level to being in the hospital. He reports daily consumption of alcohol and uses marijuana; he denies seizures or sickness with not drinking alcohol. He report hearing voices and hallucinations since last month. He also admits to cocaine use if he is unable to obtain alcohol. His dad was [id number] when he past away. Denies any medical concerns of being on daily medications. He denies any other medical problems.   Patient admits to not eating and drinking well   Past Medical History  Diagnosis Date  . Arthritis    History reviewed. No pertinent past surgical history. No family history on file. History  Substance Use Topics  . Smoking status: Current Every Day Smoker -- 10 years    Types: Cigarettes  . Smokeless tobacco: Not on file  . Alcohol Use: Yes     Comment: former    Review of Systems A complete 10 system review of systems was obtained and all systems are negative except as noted in the HPI and PMH.   Allergies  Review of patient's allergies indicates no known allergies.  Home Medications   Prior to Admission medications   Medication Sig Start Date End Date Taking? Authorizing Provider  ibuprofen (ADVIL,MOTRIN) 800 MG tablet Take 1 tablet (800 mg total) by  mouth 3 (three) times daily. 03/16/15   [name], MD  methocarbamol (ROBAXIN) 500 MG tablet Take 1 tablet (500 mg total) by mouth 2 (two) times daily. Patient not taking: Reported on 03/16/2015 02/24/13   [name], PA-C  oxyCODONE-acetaminophen (PERCOCET) 5-325 MG per tablet Take 1 tablet by mouth every 4 (four) hours as needed for pain. Patient not taking: Reported on 03/16/2015 02/24/13   [name], PA-C   BP 143/105 mmHg  Pulse 86  Temp(Src) 98.4 F (36.9 C) (Oral)  Resp 16  Ht 5\' 9"  (1.753 m)  Wt 165 lb (74.844 kg)  BMI 24.36 kg/m2  SpO2 95%  Physical Exam  Constitutional: He is oriented to person, place, and time. He appears well-developed and well-nourished.  HENT:  Head: Normocephalic and atraumatic.  Eyes: EOM are normal.  Neck: Normal range of motion. Neck supple.  Cardiovascular: Normal rate.   Pulmonary/Chest: Effort normal.  Musculoskeletal: Normal range of motion.  Neurological: He is alert and oriented to person, place, and time.  Skin: Skin is warm and dry.  Psychiatric: He is actively hallucinating (not actively hallucinating). He exhibits a depressed mood. He expresses homicidal and suicidal ideation.  Nursing note and vitals reviewed.   ED Course  Procedures (including critical care time) DIAGNOSTIC STUDIES: Oxygen Saturation is 95% on RA, normal by my interpretation.    COORDINATION OF CARE: 6:17 PM-Discussed treatment plan which includes labs and discussion  with behavioral health with pt at bedside and pt agreed to plan.   Labs Review Labs Reviewed  CBC WITH DIFFERENTIAL/PLATELET - Abnormal; Notable for the following:    WBC 13.7 (*)    RBC 3.93 (*)    Hemoglobin 12.8 (*)    HCT 38.0 (*)    Neutro Abs 10.3 (*)    All other components within normal limits  COMPREHENSIVE METABOLIC PANEL - Abnormal; Notable for the following:    Sodium 134 (*)    Chloride 99 (*)    Glucose, Bld 103 (*)    Creatinine, Ser 1.31 (*)    All other  components within normal limits  SALICYLATE LEVEL  ACETAMINOPHEN LEVEL  ETHANOL  URINE RAPID DRUG SCREEN, HOSP PERFORMED    Imaging Review No results found.   EKG Interpretation None      MDM   Final diagnoses:  Polysubstance abuse  Depression  Auditory hallucinations  Homicidal ideation  Suicidal ideation    Patient moved to pod C Home meds reviewed Holding orders placed, CIWA protocol initiated. Telepsych ordered. Labs pending- CBC and CMP have resulted showing signs of dehydration and malnutrition. No emergent intervention required- will order patient diet/fluids and encourage to eat.  Filed Vitals:   04/04/15 1744  BP: 143/105  Pulse: 86  Temp: 98.4 F (36.9 C)  Resp: 16   I personally performed the services described in this documentation, which was scribed in my presence. The recorded information has been reviewed and is accurate.   Marlon Pel, PA-C 04/04/15 1847  Mancel Bale, MD 04/05/15 0005
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
## 2015-04-04 NOTE — ED Notes (Signed)
Spoke with Tiffany PA states to hold Hydrodiuril due to BP.

## 2015-04-04 NOTE — ED Notes (Signed)
Report called to Pod C

## 2015-04-04 NOTE — ED Notes (Signed)
Pt states the he lost his father last month and has has SI/HI since then. Pt denies a specific plan. States "the urge just hits me sometimes". Pt appears anxious in triage.  pt states that he also drinks approx 120oz of beer a day. Pt reports last drink was today.

## 2015-04-04 NOTE — ED Notes (Signed)
TTS in progress. Sitter is at bedside.

## 2015-04-04 NOTE — Progress Notes (Addendum)
Patient was [facility] IP [facility]: [facility] - [facility] - per intake, fax for review. [facility] - per Olegario MessierKathy, has beds, fax referral[facility], fax referral. [facility] - per Trey Paula[facility], d/c in am, send referral. [facility] - per intake, fax it. [facility] - [facility], fax referral. [facility] - per B. Fax it.  [facility] - [facility] Theresa MulliganSheron, child/adolescent beds only. [facility] - per Mena PaulsJuanita  [facility] does not accept pt with no insurance.  CSW will continue to seek placement.  [place] Abtsatia Shakina Choy, LCSWA Disposition staff 04/04/2015 11:19 PM
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
## 2015-04-04 NOTE — ED Notes (Signed)
Gave pt snack and water, per Janee'-RN.

## 2015-04-04 NOTE — ED Notes (Signed)
Gave pt sandwich and ice water, per Jesse SansJanee - Charity fundraiserN.

## 2015-04-05 DIAGNOSIS — R45850 Homicidal ideations: Secondary | ICD-10-CM

## 2015-04-05 DIAGNOSIS — R45851 Suicidal ideations: Secondary | ICD-10-CM

## 2015-04-05 DIAGNOSIS — F1994 Other psychoactive substance use, unspecified with psychoactive substance-induced mood disorder: Secondary | ICD-10-CM

## 2015-04-05 DIAGNOSIS — F329 Major depressive disorder, single episode, unspecified: Secondary | ICD-10-CM

## 2015-04-05 DIAGNOSIS — F1910 Other psychoactive substance abuse, uncomplicated: Secondary | ICD-10-CM | POA: Diagnosis present

## 2015-04-05 DIAGNOSIS — F32A Depression, unspecified: Secondary | ICD-10-CM | POA: Diagnosis present

## 2015-04-05 DIAGNOSIS — R440 Auditory hallucinations: Secondary | ICD-10-CM | POA: Diagnosis present

## 2015-04-05 NOTE — ED Notes (Addendum)
Pt ambulatory to shower. NAD. Denies hallucinations at current time. Denies SI/HI "right now."
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
## 2015-04-05 NOTE — ED Notes (Signed)
TTS in progress 

## 2015-04-05 NOTE — ED Notes (Signed)
Lunch tray given to pt.

## 2015-04-05 NOTE — ED Notes (Signed)
Breakfast ordered 

## 2015-04-05 NOTE — ED Notes (Addendum)
Pt up for discharge. Denies SI/HI on departure. Denies hallucinations. Denies pain. CIWA remains a 0. Pt verbalizes understanding of outpatient follow up & understanding of walk in times for [facility] for new patients. Pt is having mother pick him up from ED and take him to [facility] at this time. A/o x4.

## 2015-04-05 NOTE — BHH Counselor (Signed)
[facility]

## 2015-04-05 NOTE — Progress Notes (Signed)
Patient has been cleared medically and psychiatrically to dc home. Patient [facility] as he has been there before and aware of services.  [facility]  Patient lacks insurance, thus at this time this is his only option and he will walk in for appointment.  Patient has no questions at this time.  LCSW explored care coordination referral and at this time patient does not meet criteria for referral.  Deretha EmoryHannah Iqra Rotundo LCSW, MSW Clinical Social Work: Emergency Room [phone]

## 2015-04-05 NOTE — ED Notes (Signed)
Breakfast given.  

## 2015-04-05 NOTE — Progress Notes (Signed)
Per Renata Capriceonrad, NP, patient can be released to home with outpatient follow up appointment arranged- he has no insurance. CSW contacting [facility] CSW to update and facilitate above needs.   Reece LevyJanet Neev Mcmains, MSW, Theresia MajorsLCSWA  [phone]

## 2015-04-05 NOTE — ED Notes (Signed)
TTS machine at bedside with patient for assessment.

## 2015-08-14 ENCOUNTER — Emergency Department (HOSPITAL_COMMUNITY)
Admission: EM | Admit: 2015-08-14 | Discharge: 2015-08-14 | Disposition: A | Discharge status: Home / Self Care | Payer: No Typology Code available for payment source | Attending: Emergency Medicine | Admitting: Emergency Medicine

## 2015-08-14 ENCOUNTER — Emergency Department (HOSPITAL_COMMUNITY): Payer: No Typology Code available for payment source

## 2015-08-14 ENCOUNTER — Encounter (HOSPITAL_COMMUNITY): Payer: Self-pay | Admitting: *Deleted

## 2015-08-14 DIAGNOSIS — F17210 Nicotine dependence, cigarettes, uncomplicated: Secondary | ICD-10-CM | POA: Diagnosis not present

## 2015-08-14 DIAGNOSIS — Y9389 Activity, other specified: Secondary | ICD-10-CM | POA: Insufficient documentation

## 2015-08-14 DIAGNOSIS — Y92410 Unspecified street and highway as the place of occurrence of the external cause: Secondary | ICD-10-CM | POA: Insufficient documentation

## 2015-08-14 DIAGNOSIS — Y998 Other external cause status: Secondary | ICD-10-CM | POA: Diagnosis not present

## 2015-08-14 DIAGNOSIS — M5136 Other intervertebral disc degeneration, lumbar region: Secondary | ICD-10-CM | POA: Diagnosis not present

## 2015-08-14 DIAGNOSIS — M545 Low back pain, unspecified: Secondary | ICD-10-CM

## 2015-08-14 DIAGNOSIS — M51369 Other intervertebral disc degeneration, lumbar region without mention of lumbar back pain or lower extremity pain: Secondary | ICD-10-CM

## 2015-08-14 DIAGNOSIS — S3992XA Unspecified injury of lower back, initial encounter: Secondary | ICD-10-CM | POA: Diagnosis present

## 2015-08-14 MED ORDER — METHOCARBAMOL 500 MG PO TABS
1000.0000 mg | ORAL_TABLET | Freq: Once | ORAL | Status: AC
  Administered 2015-08-14: 1000 mg via ORAL
  Filled 2015-08-14: qty 2

## 2015-08-14 MED ORDER — METHOCARBAMOL 500 MG PO TABS: 500.0000 mg | ORAL_TABLET | Freq: Four times a day (QID) | ORAL | Status: DC | PRN

## 2015-08-14 MED ORDER — IBUPROFEN 800 MG PO TABS: 800.0000 mg | ORAL_TABLET | Freq: Three times a day (TID) | ORAL | Status: DC | PRN
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
## 2015-12-12 ENCOUNTER — Emergency Department (HOSPITAL_COMMUNITY)
Admission: EM | Admit: 2015-12-12 | Discharge: 2015-12-12 | Disposition: A | Discharge status: Home / Self Care | Payer: Self-pay | Attending: Emergency Medicine | Admitting: Emergency Medicine

## 2015-12-12 ENCOUNTER — Emergency Department (HOSPITAL_COMMUNITY): Payer: Self-pay

## 2015-12-12 ENCOUNTER — Encounter (HOSPITAL_COMMUNITY): Payer: Self-pay | Admitting: Emergency Medicine

## 2015-12-12 DIAGNOSIS — Y9289 Other specified places as the place of occurrence of the external cause: Secondary | ICD-10-CM | POA: Insufficient documentation

## 2015-12-12 DIAGNOSIS — F17210 Nicotine dependence, cigarettes, uncomplicated: Secondary | ICD-10-CM | POA: Insufficient documentation

## 2015-12-12 DIAGNOSIS — Y93E5 Activity, floor mopping and cleaning: Secondary | ICD-10-CM | POA: Insufficient documentation

## 2015-12-12 DIAGNOSIS — Y998 Other external cause status: Secondary | ICD-10-CM | POA: Insufficient documentation

## 2015-12-12 DIAGNOSIS — S299XXA Unspecified injury of thorax, initial encounter: Secondary | ICD-10-CM

## 2015-12-12 DIAGNOSIS — S29001A Unspecified injury of muscle and tendon of front wall of thorax, initial encounter: Secondary | ICD-10-CM | POA: Insufficient documentation

## 2015-12-12 DIAGNOSIS — W19XXXA Unspecified fall, initial encounter: Secondary | ICD-10-CM

## 2015-12-12 DIAGNOSIS — W11XXXA Fall on and from ladder, initial encounter: Secondary | ICD-10-CM | POA: Insufficient documentation

## 2015-12-12 DIAGNOSIS — M1990 Unspecified osteoarthritis, unspecified site: Secondary | ICD-10-CM | POA: Insufficient documentation

## 2015-12-12 MED ORDER — OXYCODONE-ACETAMINOPHEN 5-325 MG PO TABS: 1.0000 | ORAL_TABLET | Freq: Four times a day (QID) | ORAL | Status: DC | PRN

## 2015-12-12 MED ORDER — IBUPROFEN 400 MG PO TABS
600.0000 mg | ORAL_TABLET | Freq: Once | ORAL | Status: AC
  Administered 2015-12-12: 600 mg via ORAL
  Filled 2015-12-12: qty 1

## 2015-12-12 MED ORDER — IBUPROFEN 600 MG PO TABS: 600.0000 mg | ORAL_TABLET | Freq: Three times a day (TID) | ORAL | Status: DC | PRN

## 2015-12-12 MED ORDER — OXYCODONE-ACETAMINOPHEN 5-325 MG PO TABS
2.0000 | ORAL_TABLET | Freq: Once | ORAL | Status: AC
  Administered 2015-12-12: 2 via ORAL
  Filled 2015-12-12: qty 2

## 2015-12-12 NOTE — ED Notes (Signed)
Pt. jumped from a ladder and landed on his feet , pt. stated " it jarred me and felt a pop " reports pain at right anterior ribcage worse with movement and changing positions , respirations unlabored .

## 2015-12-12 NOTE — ED Notes (Signed)
Pt states mother will give ride home. Verbalized understanding to not drive/operate heavy machinery after taking percocet.

## 2015-12-12 NOTE — ED Provider Notes (Signed)
CSN: [id number]     Arrival date & time 12/12/15  0240 History   First MD Initiated Contact with Patient 12/12/15 0915     Chief Complaint  Patient presents with  . Rib Injury     (Consider location/radiation/quality/duration/timing/severity/associated sxs/prior Treatment) HPI  45 year old male presents after a fall last night. Was on a ladder cleaning the roof last night (~10 pm) and the ladder slipped and he fell. He landed on his feet. Thinks it jarred his chest and has had right anterior lower chest wall pain over ribs. Feels like a rib keeps popping in and out. Heard a pop when he landed. No headache, back pain, abdominal pain, or feet pain. No knee/hip pain. Has not taken anything for the pain. CP hurts worse with any type of movement, cough or laughing.  Past Medical History  Diagnosis Date  . Arthritis    History reviewed. No pertinent past surgical history. No family history on file. Social History  Substance Use Topics  . Smoking status: Current Every Day Smoker -- 10 years    Types: Cigarettes  . Smokeless tobacco: None  . Alcohol Use: Yes     Comment: former    Review of Systems  Respiratory: Negative for shortness of breath.   Cardiovascular: Positive for chest pain.  Musculoskeletal: Negative for back pain, joint swelling, arthralgias and neck pain.  Neurological: Negative for syncope, weakness, numbness and headaches.  All other systems reviewed and are negative.     Allergies  Review of patient's allergies indicates no known allergies.  Home Medications   Prior to Admission medications   Medication Sig Start Date End Date Taking? Authorizing Provider  ibuprofen (ADVIL,MOTRIN) 600 MG tablet Take 1 tablet (600 mg total) by mouth every 8 (eight) hours as needed. 12/12/15   Pricilla LovelessScott Sidni Fusco, MD  methocarbamol (ROBAXIN) 500 MG tablet Take 1-2 tablets (500-1,000 mg total) by mouth every 6 (six) hours as needed for muscle spasms (and pain). 08/14/15   Trixie DredgeEmily West,  PA-C  oxyCODONE-acetaminophen (PERCOCET) 5-325 MG tablet Take 1-2 tablets by mouth every 6 (six) hours as needed for severe pain. 12/12/15   Pricilla LovelessScott Demarri Elie, MD   BP 107/54 mmHg  Pulse 70  Temp(Src) 98.3 F (36.8 C) (Oral)  Resp 18  Ht 5\' 9"  (1.753 m)  Wt 166 lb (75.297 kg)  BMI 24.50 kg/m2  SpO2 97% Physical Exam  Constitutional: He is oriented to person, place, and time. He appears well-developed and well-nourished.  HENT:  Head: Normocephalic and atraumatic.  Right Ear: External ear normal.  Left Ear: External ear normal.  Nose: Nose normal.  Eyes: Right eye exhibits no discharge. Left eye exhibits no discharge.  Neck: Normal range of motion. Neck supple.  Cardiovascular: Normal rate, regular rhythm, normal heart sounds and intact distal pulses.   Pulmonary/Chest: Effort normal and breath sounds normal. He exhibits tenderness.    Lower right chest tenderness, no ecchymosis or deformity. No crepitus  Abdominal: Soft. He exhibits no distension. There is no tenderness.  Musculoskeletal: He exhibits no edema.       Thoracic back: He exhibits no tenderness.       Lumbar back: He exhibits no tenderness.       Right foot: There is no tenderness.       Left foot: There is no tenderness.  Neurological: He is alert and oriented to person, place, and time.  Skin: Skin is warm and dry.  Nursing note and vitals reviewed.   ED  Course  Procedures (including critical care time) Labs Review Labs Reviewed - No data to display  Imaging Review Dg Ribs Unilateral W/chest Right  12/12/2015  CLINICAL DATA:  Jumped from ladder with right rib pain. Pain with deep inspiration. Initial encounter. EXAM: RIGHT RIBS AND CHEST - 3+ VIEW COMPARISON:  None. FINDINGS: No fracture or other bone lesions are seen involving the ribs. There is no evidence of pneumothorax or pleural effusion. Both lungs are clear. Heart size and mediastinal contours are within normal limits. IMPRESSION: Negative. Electronically  Signed   By: Marnee Spring M.D.   On: 12/12/2015 03:28   I have personally reviewed and evaluated these images and lab results as part of my medical decision-making.   EKG Interpretation None      MDM   Final diagnoses:  Fall, initial encounter  Chest wall injury, initial encounter    Patient's exam shows only focal tenderness over lower chest wall on right. No abdominal tenderness. Landed on his feet from a ladder but no calcaneal tenderness or back pain/tenderness. Treat with NSAIDs, ice, incentive spirometer, and narcotics for breakthrough pain. F/u with PCP.    Pricilla Loveless, MD 12/12/15 1730

## 2015-12-12 NOTE — ED Notes (Signed)
Happy meal and OJ provided per Dr. Criss AlvineGoldston

## 2015-12-12 NOTE — ED Notes (Signed)
RN reviewed use of incentive spirometer with pt. Pt able to demonstrate proper use. No further questions/concerns.

## 2015-12-12 NOTE — ED Notes (Signed)
Pt verbalized understanding of d/c instructions, prescriptions, and follow-up care. No further questions/concerns, VSS, assisted to lobby in wheelchair.  

## 2016-11-16 IMAGING — DX DG RIBS W/ CHEST 3+V*R*
3 series · 3 of 3 positions shown · non-contrast
Comparison: None.

CLINICAL DATA: Jumped from ladder with right rib pain. Pain with
deep inspiration. Initial encounter.

EXAM:
RIGHT RIBS AND CHEST - 3+ VIEW

[chest pa]
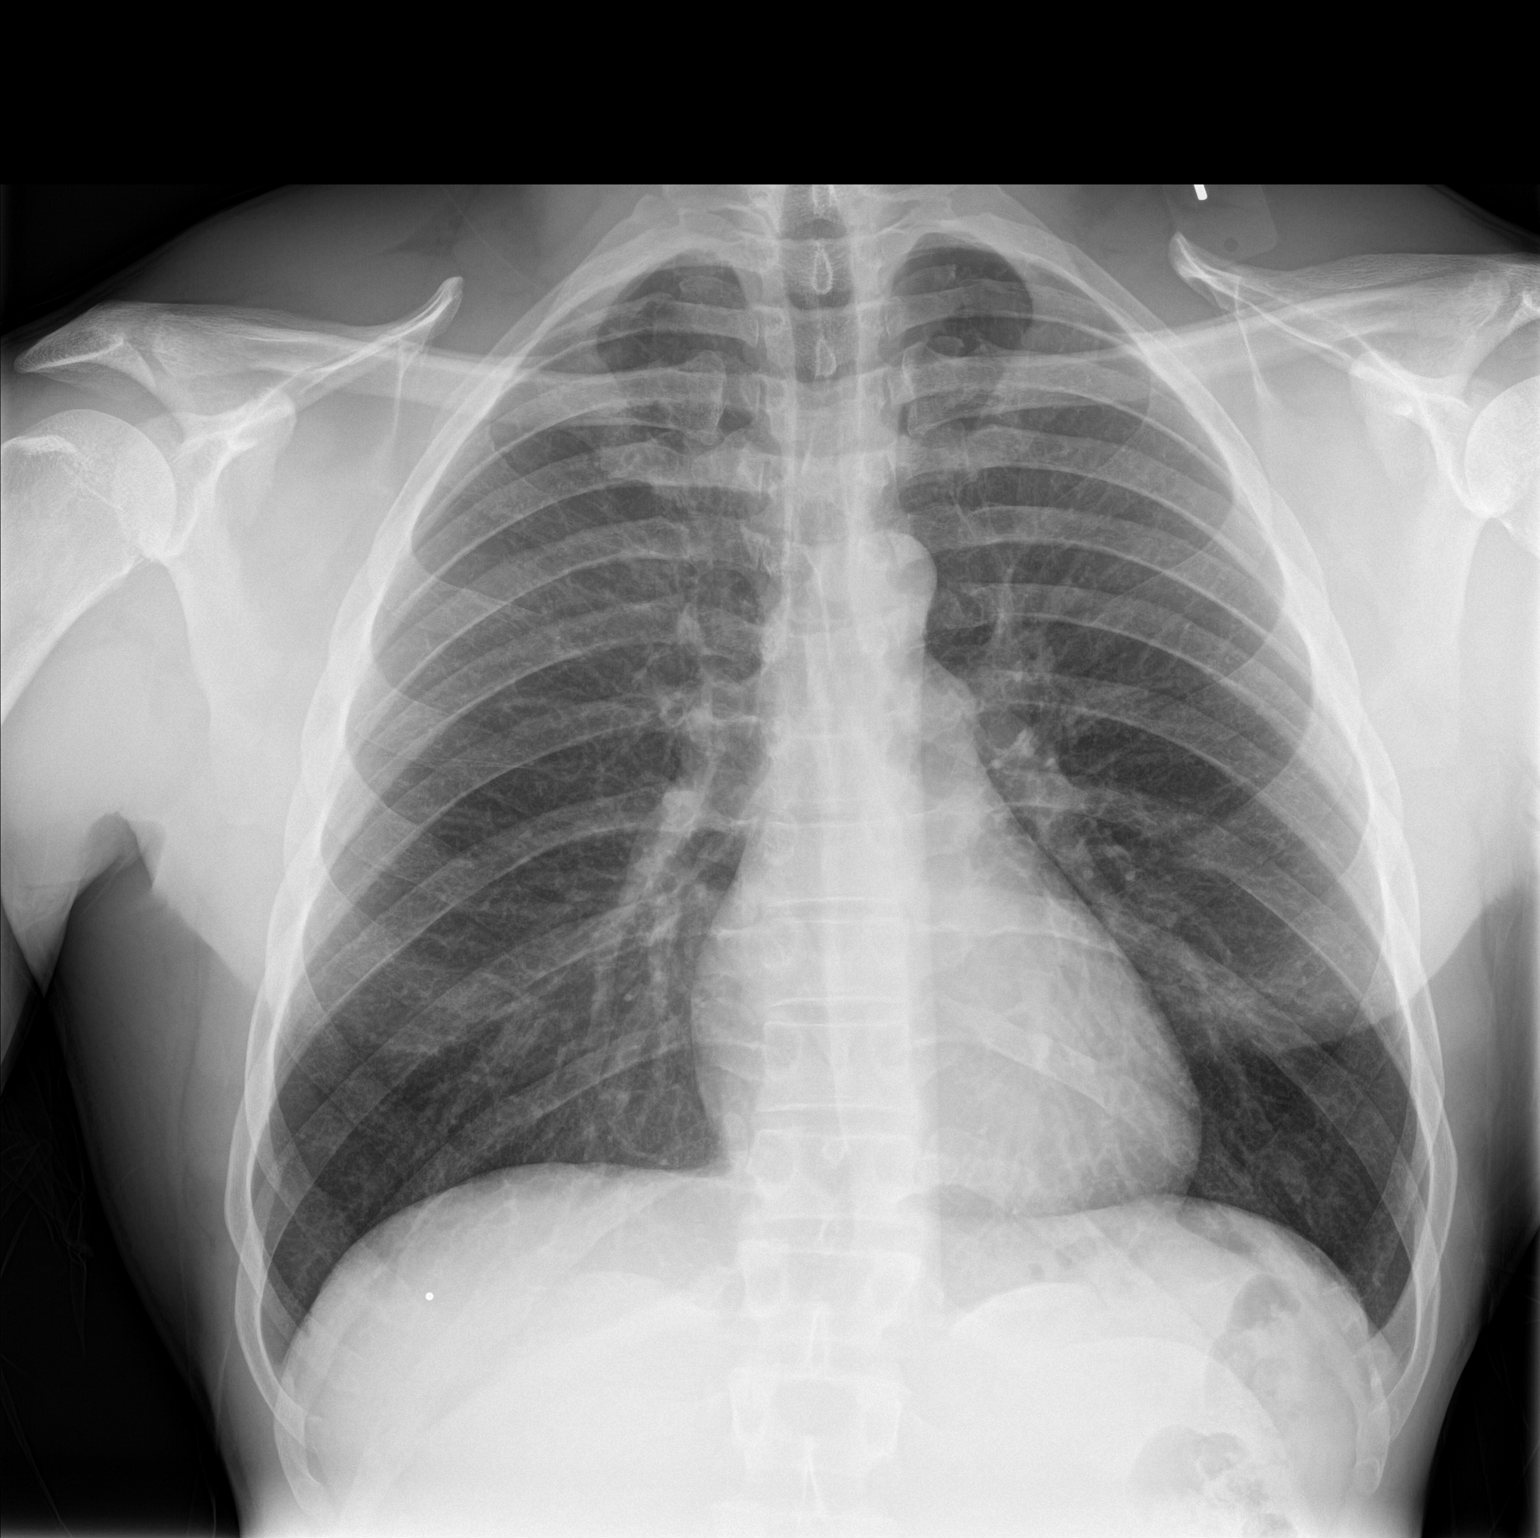

[rib pa]
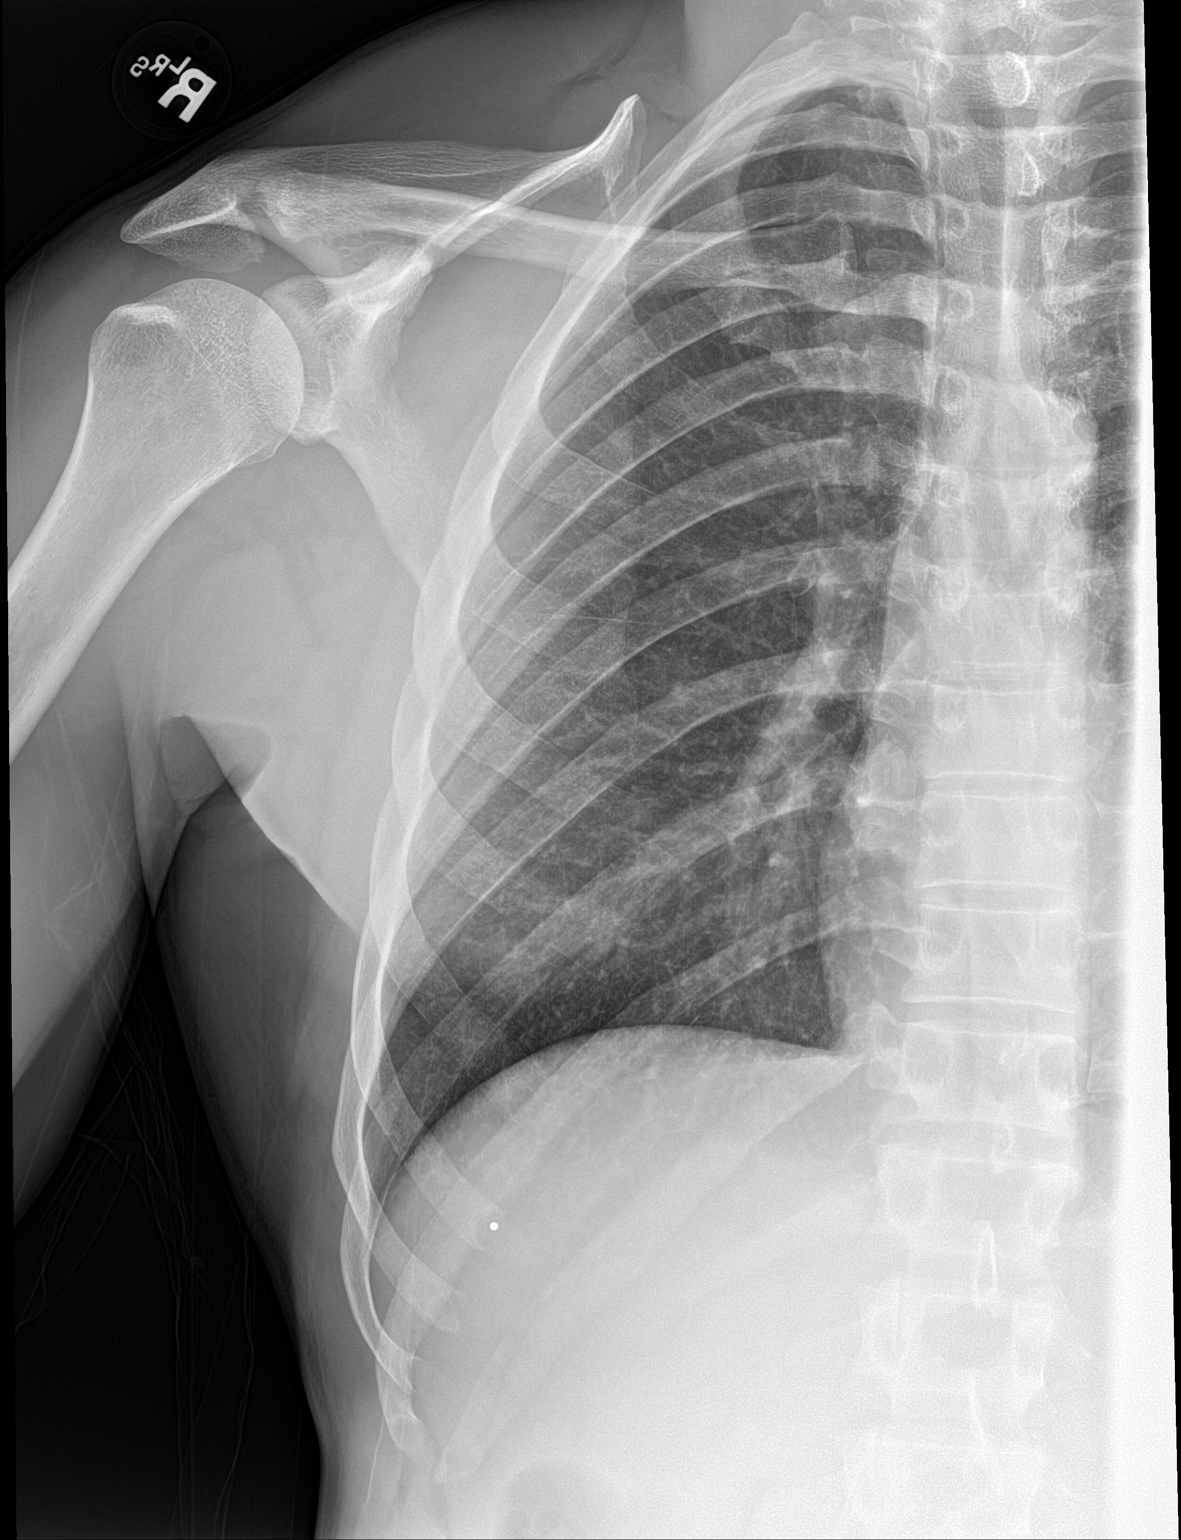

[rib pa obl]
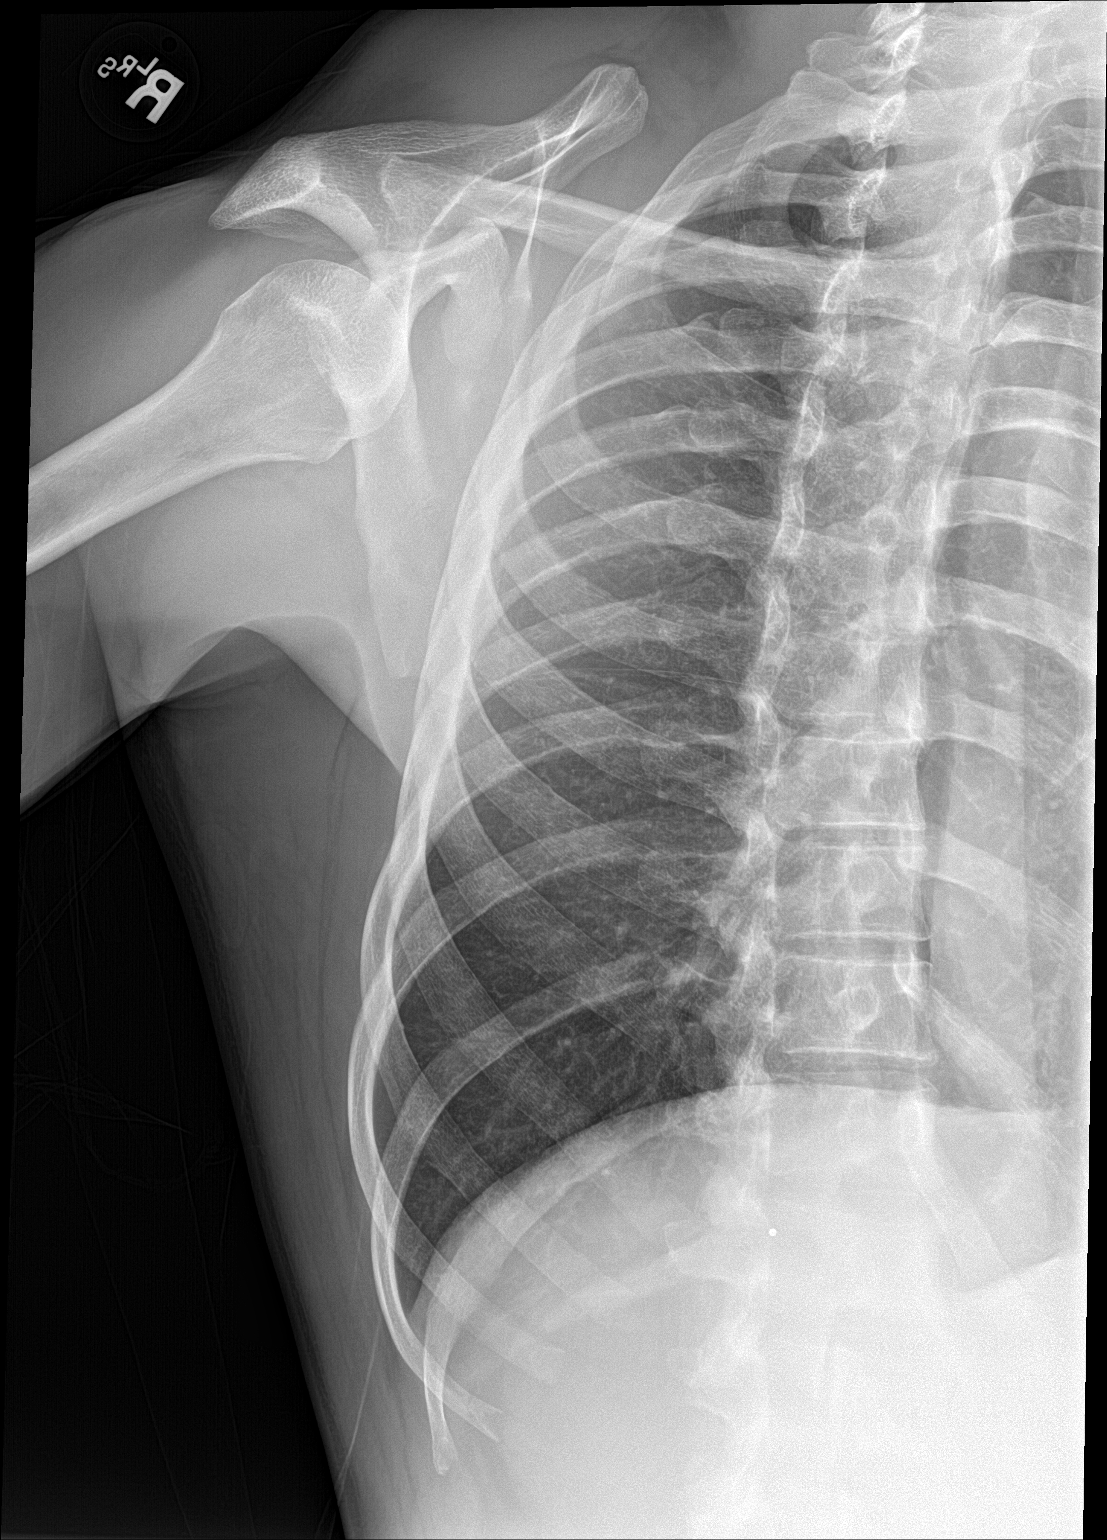

[3 of 3 positions shown; findings below may reference images not displayed]

FINDINGS: No fracture or other bone lesions are seen involving the ribs. There
is no evidence of pneumothorax or pleural effusion. Both lungs are
clear. Heart size and mediastinal contours are within normal limits.
IMPRESSION: Negative.

## 2017-03-31 ENCOUNTER — Emergency Department (HOSPITAL_COMMUNITY)
Admission: EM | Admit: 2017-03-31 | Discharge: 2017-03-31 | Disposition: A | Discharge status: Home / Self Care | Payer: Self-pay | Attending: Emergency Medicine | Admitting: Emergency Medicine

## 2017-03-31 ENCOUNTER — Encounter (HOSPITAL_COMMUNITY): Payer: Self-pay

## 2017-03-31 DIAGNOSIS — S20469A Insect bite (nonvenomous) of unspecified back wall of thorax, initial encounter: Secondary | ICD-10-CM | POA: Insufficient documentation

## 2017-03-31 DIAGNOSIS — Y998 Other external cause status: Secondary | ICD-10-CM | POA: Insufficient documentation

## 2017-03-31 DIAGNOSIS — F17210 Nicotine dependence, cigarettes, uncomplicated: Secondary | ICD-10-CM | POA: Insufficient documentation

## 2017-03-31 DIAGNOSIS — W57XXXA Bitten or stung by nonvenomous insect and other nonvenomous arthropods, initial encounter: Secondary | ICD-10-CM | POA: Insufficient documentation

## 2017-03-31 DIAGNOSIS — Y9259 Other trade areas as the place of occurrence of the external cause: Secondary | ICD-10-CM | POA: Insufficient documentation

## 2017-03-31 DIAGNOSIS — Y939 Activity, unspecified: Secondary | ICD-10-CM | POA: Insufficient documentation

## 2017-03-31 DIAGNOSIS — S80869A Insect bite (nonvenomous), unspecified lower leg, initial encounter: Secondary | ICD-10-CM | POA: Insufficient documentation

## 2017-03-31 DIAGNOSIS — S40869A Insect bite (nonvenomous) of unspecified upper arm, initial encounter: Secondary | ICD-10-CM | POA: Insufficient documentation

## 2017-03-31 MED ORDER — PREDNISONE 20 MG PO TABS
40.0000 mg | ORAL_TABLET | Freq: Once | ORAL | Status: AC
  Administered 2017-03-31: 40 mg via ORAL
  Filled 2017-03-31: qty 2

## 2017-03-31 MED ORDER — HYDROXYZINE HCL 25 MG PO TABS
25.0000 mg | ORAL_TABLET | Freq: Once | ORAL | Status: AC
  Administered 2017-03-31: 25 mg via ORAL
  Filled 2017-03-31: qty 1

## 2017-03-31 MED ORDER — TRIAMCINOLONE ACETONIDE 0.1 % EX CREA: 1.0000 "application " | TOPICAL_CREAM | Freq: Two times a day (BID) | CUTANEOUS | 0 refills | Status: DC

## 2017-03-31 MED ORDER — HYDROXYZINE HCL 25 MG PO TABS: 25.0000 mg | ORAL_TABLET | Freq: Four times a day (QID) | ORAL | 0 refills | Status: DC | PRN

## 2017-03-31 MED ORDER — PREDNISONE 20 MG PO TABS: 40.0000 mg | ORAL_TABLET | Freq: Every day | ORAL | 0 refills | Status: AC

## 2017-03-31 NOTE — ED Triage Notes (Signed)
Per Pt, Pt was at a motel on Friday when he started to notice bug bites all on his back and arms. Pt saw the bed bugs in the motel. Reports that he is wearing clothes that were not in the motel room and he has not seen any physical bugs in his clothes.

## 2017-03-31 NOTE — ED Provider Notes (Signed)
[facility] Emergency Department Provider Note  By signing my name below, I, Sonum Patel, attest that this documentation has been prepared under the direction and in the presence of Wille Glaser, NP. Electronically Signed: Sonum Patel, Neurosurgeon. 03/31/17. 11:24 AM.  ED Clinical Impression   Insect bite, initial encounter  Bedbug bite, initial encounter  History   Chief Complaint Insect Bite   HPI  Patient is a 46 y.o. male presenting to the ED for a pruritic rash to the back, upper arms, and legs that started 3 days ago. Patient states he stayed at a motel room when he saw bed bugs in the bed. He has not tried any OTC medications or creams. Denies bleeding or drainage from rash. Denies fevers, chills, unexplained weight loss, dizziness, vision or gait changes, CP, SOB, cough, abd pain, n/v/d, dysuria, extremity numbness or tingling, extremity weakness, or any additional concerns. Denies new soaps/lotions/detergents/shampoos, pets in house, family or friends with similar symptoms, recent travel, or any additional known exposures.   Past Medical History:  Diagnosis Date  . Arthritis     History reviewed. No pertinent surgical history.  Current Outpatient Rx  . Order #: [id number] Class: Print  . Order #: [id number] Class: Print  . Order #: [id number] Class: Print    Allergies Patient has no known allergies.  No family history on file.  Social History Social History  Substance Use Topics  . Smoking status: Current Every Day Smoker    Packs/day: 0.50    Years: 10.00    Types: Cigarettes  . Smokeless tobacco: Never Used  . Alcohol use Yes     Comment: former    Review of Systems  Constitutional: Negative for fever, chills, or unexplained weight loss. Eyes: Negative for visual changes. ENT: Negative for nasal congestion, ear pain, or sore throat. No oral lesions. Cardiovascular: Negative for chest pain or extremity swelling. Respiratory: Negative for shortness of breath or  cough. Gastrointestinal: Negative for abdominal pain, nausea, vomiting, or diarrhea. Musculoskeletal: Negative for back pain or extremity pain/swelling. Skin: Positive for rash. Neurological: Negative for headaches,dizziness, focal weakness, or numbness/tingling.  Physical Exam   VITAL SIGNS:   ED Triage Vitals  Enc Vitals Group     BP 03/31/17 0927 135/84     Pulse Rate 03/31/17 0927 (!) 107     Resp 03/31/17 0927 16     Temp 03/31/17 0927 98.1 F (36.7 C)     Temp Source 03/31/17 0927 Oral     SpO2 03/31/17 0927 98 %     Weight 03/31/17 0927 155 lb (70.3 kg)     Height 03/31/17 0927 5\' 7"  (1.702 m)     Head Circumference --      Peak Flow --      Pain Score 03/31/17 0926 0     Pain Loc --      Pain Edu? --      Excl. in GC? --      Constitutional: Alert and oriented. Well appearing and in no respiratory apparent distress. Eyes: PERRL, EOMI, Conjunctivae normal ENT      Head: Normocephalic and atraumatic.      Ears: TM intact without erythema or effusion. Auditory canals normal.       Nose: No congestion.      Mouth/Throat: Mucous membranes are moist. Oropharynx without erythema or exudate. Normal voice, handling secretions normally. No oral lesions noted.       Neck: Supple, no nuchal signs, full active ROM of neck. Cardiovascular: Normal S1  S2, regular rhythm, normal rate. Normal and symmetric distal pulses are present in all extremities. Respiratory: Breath sounds clear and equal bilaterally. No wheezes, rales, or rhonchi. Normal respiratory effort.  Gastrointestinal: Abdomen soft and nontender. No rebound or guarding. There is no CVA tenderness. Back: No midline tenderness Musculoskeletal: Nontender with normal range of motion in all extremities. Neurologic: Speech clear. Alert and appropriate, no gross focal neurologic deficits are appreciated. Gait steady with ambulation. Equal strength in all four extremities. Extremities neurovascularly intact.  Skin: +linear  erythematous papules to bilateral arms, legs, and back with pruritis. No central clearing.No web spacing involvement. No bleeding or drainage from sites. Skin is warm, dry. Psychiatric: Mood and affect are normal. Speech and behavior are normal.    ED Course, Assessment and Plan   Pt is a 46 y/o M, afebrile, who presents to ED for rash, concern for bed bug bites. No petechia or purpura, no involvement of mucous membranes, no involvement of palms or soles of feet. Doubt Gwynneth AlbrightSteven Johnsons Syndrome, Toxic Epidermal Necrolysis, staph scalded skin syndrome, RMSF, syphilis, Lyme Disease, subcutaneous abscess, or necrotizing soft tissue infection. Will plan to discharge with kenalog cream, atarax, and prednisone given diffuse locations, follow up with PCP; pt amenable to this plan.  11:32 AM Discussed results, discharge instructions, rx and safety, return precautions, and follow up. Pt verbalizes understanding using verbal teachback and agrees with plan, denies any additional concerns.   Previous chart, nursing notes, and vital signs reviewed.    Pertinent labs & imaging results that were available during my care of the patient were reviewed by me and considered in my medical decision making (see chart for details).   I personally performed the services described in this documentation, which was scribed in my presence. The recorded information has been reviewed and is accurate.   Yunique Dearcos, Hinton Dyerobyn K, NP 04/02/17 [id number]    Linwood DibblesKnapp, Jon, MD 04/02/17 [phone]

## 2017-03-31 NOTE — Discharge Instructions (Signed)
Please take prednisone (steroid) as prescribed--take with food to prevent GI upset. Take Atarax as needed as prescribed for itch--caution may cause sedation--do not drink alcohol, drive, or operate machinery while taking. Use Triamcinolone cream twice a day to areas as well--do not use for more than 1 week. Please call to schedule a follow up appointment with your primary care doctor in 5-7 days. Please return to the ER if you experience fevers, chills, unexplained weight loss, dizziness, vision or gait changes, chest pain, shortness of breath, abdominal pain, nausea, vomiting, diarrhea, redness/swelling/warmth/pain to areas, bleeding or drainage from areas, worsening symptoms, or any additional concerns.   Your bedroom needs to be treated by a pest control expert. You may also need to throw away mattresses or luggage. To help keep bedbugs from coming back, you may want to: Put a plastic cover over your mattress. Wash your clothes and bedding in water that is hotter than 120F (48.9C). Dry them on a hot setting. Vacuum often around the bed and in all of the cracks where the bugs might hide. Check all used furniture, bedding, or clothes that you bring into your home. Get rid of bird nests and bat roosts that are near your home.

## 2018-05-13 ENCOUNTER — Emergency Department (HOSPITAL_COMMUNITY)
Admission: EM | Admit: 2018-05-13 | Discharge: 2018-05-13 | Disposition: A | Discharge status: Home / Self Care | Payer: Self-pay | Attending: Emergency Medicine | Admitting: Emergency Medicine

## 2018-05-13 ENCOUNTER — Encounter (HOSPITAL_COMMUNITY): Payer: Self-pay | Admitting: Emergency Medicine

## 2018-05-13 ENCOUNTER — Emergency Department (HOSPITAL_COMMUNITY): Payer: Self-pay

## 2018-05-13 ENCOUNTER — Other Ambulatory Visit: Payer: Self-pay

## 2018-05-13 DIAGNOSIS — Y939 Activity, unspecified: Secondary | ICD-10-CM | POA: Insufficient documentation

## 2018-05-13 DIAGNOSIS — S0003XA Contusion of scalp, initial encounter: Secondary | ICD-10-CM | POA: Insufficient documentation

## 2018-05-13 DIAGNOSIS — Y999 Unspecified external cause status: Secondary | ICD-10-CM | POA: Insufficient documentation

## 2018-05-13 DIAGNOSIS — F17210 Nicotine dependence, cigarettes, uncomplicated: Secondary | ICD-10-CM | POA: Insufficient documentation

## 2018-05-13 DIAGNOSIS — F1910 Other psychoactive substance abuse, uncomplicated: Secondary | ICD-10-CM | POA: Insufficient documentation

## 2018-05-13 DIAGNOSIS — Y929 Unspecified place or not applicable: Secondary | ICD-10-CM | POA: Insufficient documentation

## 2018-05-13 LAB — COMPREHENSIVE METABOLIC PANEL
ALT: 55 U/L — ABNORMAL HIGH (ref 0–44)
AST: 52 U/L — ABNORMAL HIGH (ref 15–41)
Albumin: 3.6 g/dL (ref 3.5–5.0)
Alkaline Phosphatase: 48 U/L (ref 38–126)
Anion gap: 5 (ref 5–15)
BUN: 13 mg/dL (ref 6–20)
CO2: 28 mmol/L (ref 22–32)
Calcium: 8.9 mg/dL (ref 8.9–10.3)
Chloride: 107 mmol/L (ref 98–111)
Creatinine, Ser: 1.25 mg/dL — ABNORMAL HIGH (ref 0.61–1.24)
GFR calc Af Amer: >60 mL/min (ref >60)
GFR calc non Af Amer: >60 mL/min (ref >60)
Glucose, Bld: 93 mg/dL (ref 70–99)
Potassium: 4.3 mmol/L (ref 3.5–5.1)
Sodium: 140 mmol/L (ref 135–145)
Total Bilirubin: 0.7 mg/dL (ref 0.3–1.2)
Total Protein: 6.8 g/dL (ref 6.5–8.1)

## 2018-05-13 LAB — CBC WITH DIFFERENTIAL/PLATELET
Abs Immature Granulocytes: 0.1 10*3/uL (ref 0.0–0.1)
Basophils Absolute: 0.1 10*3/uL (ref 0.0–0.1)
Basophils Relative: 1 %
Eosinophils Absolute: 0.2 10*3/uL (ref 0.0–0.7)
Eosinophils Relative: 2 %
HCT: 40.8 % (ref 39.0–52.0)
Hemoglobin: 13.1 g/dL (ref 13.0–17.0)
Immature Granulocytes: 1 %
Lymphocytes Relative: 24 %
Lymphs Abs: 2.5 10*3/uL (ref 0.7–4.0)
MCH: 32.5 pg (ref 26.0–34.0)
MCHC: 32.1 g/dL (ref 30.0–36.0)
MCV: 101.2 fL — ABNORMAL HIGH (ref 78.0–100.0)
Monocytes Absolute: 0.8 10*3/uL (ref 0.1–1.0)
Monocytes Relative: 8 %
Neutro Abs: 6.6 10*3/uL (ref 1.7–7.7)
Neutrophils Relative %: 64 %
Platelets: 157 10*3/uL (ref 150–400)
RBC: 4.03 MIL/uL — ABNORMAL LOW (ref 4.22–5.81)
RDW: 12.2 % (ref 11.5–15.5)
WBC: 10.2 10*3/uL (ref 4.0–10.5)

## 2018-05-13 LAB — RAPID URINE DRUG SCREEN, HOSP PERFORMED
Amphetamines: NOT DETECTED
Barbiturates: NOT DETECTED
Benzodiazepines: NOT DETECTED
Cocaine: POSITIVE — AB
Opiates: NOT DETECTED
Tetrahydrocannabinol: NOT DETECTED

## 2018-05-13 LAB — ETHANOL: Alcohol, Ethyl (B): <10 mg/dL (ref <10)

## 2018-05-13 MED ORDER — THIAMINE HCL 100 MG/ML IJ SOLN
Freq: Once | INTRAVENOUS | Status: DC
  Filled 2018-05-13: qty 1000

## 2018-05-13 MED ORDER — IBUPROFEN 800 MG PO TABS
800.0000 mg | ORAL_TABLET | Freq: Once | ORAL | Status: AC
  Administered 2018-05-13: 800 mg via ORAL
  Filled 2018-05-13: qty 1
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
## 2021-02-12 ENCOUNTER — Encounter (HOSPITAL_COMMUNITY): Payer: Self-pay

## 2021-02-12 ENCOUNTER — Emergency Department (HOSPITAL_COMMUNITY)
Admission: EM | Admit: 2021-02-12 | Discharge: 2021-02-12 | Disposition: A | Discharge status: Home / Self Care | Payer: Self-pay | Attending: Emergency Medicine | Admitting: Emergency Medicine

## 2021-02-12 ENCOUNTER — Other Ambulatory Visit: Payer: Self-pay

## 2021-02-12 DIAGNOSIS — L230 Allergic contact dermatitis due to metals: Secondary | ICD-10-CM | POA: Insufficient documentation

## 2021-02-12 DIAGNOSIS — F17210 Nicotine dependence, cigarettes, uncomplicated: Secondary | ICD-10-CM | POA: Insufficient documentation

## 2021-02-12 MED ORDER — BARRIER CREAM NON-SPECIFIED
1.0000 "application " | TOPICAL_CREAM | Freq: Once | TOPICAL | Status: AC
  Administered 2021-02-12: 1 via TOPICAL
  Filled 2021-02-12 (×2): qty 1

## 2021-02-12 MED ORDER — NYSTATIN 100000 UNIT/GM EX CREA: TOPICAL_CREAM | CUTANEOUS | 0 refills | Status: DC

## 2021-02-12 MED ORDER — CEPHALEXIN 500 MG PO CAPS: 500.0000 mg | ORAL_CAPSULE | Freq: Four times a day (QID) | ORAL | 0 refills | Status: AC

## 2021-02-12 MED ORDER — ZINC OXIDE 12.8 % EX OINT: 1.0000 "application " | TOPICAL_OINTMENT | CUTANEOUS | 0 refills | Status: DC | PRN

## 2021-02-12 MED ORDER — DIPHENHYDRAMINE HCL 25 MG PO CAPS
25.0000 mg | ORAL_CAPSULE | Freq: Once | ORAL | Status: AC
  Administered 2021-02-12: 25 mg via ORAL
  Filled 2021-02-12: qty 1

## 2021-02-12 MED ORDER — IBUPROFEN 400 MG PO TABS
600.0000 mg | ORAL_TABLET | Freq: Once | ORAL | Status: AC
  Administered 2021-02-12: 600 mg via ORAL
  Filled 2021-02-12: qty 1

## 2021-02-12 NOTE — ED Provider Notes (Incomplete)
I saw and evaluated the patient, reviewed the resident's note and I agree with the findings and plan.       Burns Timson, MD 07/14/22 1140  

## 2021-02-12 NOTE — ED Triage Notes (Signed)
Pt presents with a rash to his lower abd area x1 week

## 2021-02-12 NOTE — Discharge Instructions (Addendum)
Take Keflex 4 times daily for 5 days. Apply barrier cream twice daily for 5 days. Is important that you establish care with a normal doctor, call the number provided. You can take ibuprofen and Tylenol as needed for pain. Benadryl may help as well for itching, but it can make you very sleepy.

## 2021-02-12 NOTE — ED Provider Notes (Signed)
[facility] EMERGENCY DEPARTMENT Provider Note   CSN: [id number] Arrival date & time: 02/12/21  7741     History Chief Complaint  Patient presents with  . Rash    Donald Bentley is a 50 y.o. male.  HPI 50 year old male with history of polysubstance abuse and depression presents emergency department for rash.  States that has been present for 1 week, constant and worsening.  Is on his lower abdomen, and is associated with itching and burning.  States that it started after he wore a belt.  States he has had a rash associated with seen belt before, but has not worn the belt in 5 days.  Has been trying hydrocortisone cream and Benadryl with some improvement.  States that the itching and burning is currently severe.  Denies genital involvement or rash elsewhere. No urinary symptoms.     Past Medical History:  Diagnosis Date  . Arthritis     Patient Active Problem List   Diagnosis Date Noted  . Auditory hallucinations   . Depression   . Homicidal ideation   . Polysubstance abuse (HCC)   . Suicidal ideation   . Substance induced mood disorder (HCC)     No past surgical history on file.     No family history on file.  Social History   Tobacco Use  . Smoking status: Current Every Day Smoker    Packs/day: 0.50    Years: 10.00    Pack years: 5.00    Types: Cigarettes  . Smokeless tobacco: Never Used  Substance Use Topics  . Alcohol use: Yes    Comment: former  . Drug use: Yes    Frequency: 3.0 times per week    Types: Marijuana, Cocaine    Comment: fomer    Home Medications Prior to Admission medications   Medication Sig Start Date End Date Taking? Authorizing Provider  cephALEXin (KEFLEX) 500 MG capsule Take 1 capsule (500 mg total) by mouth 4 (four) times daily for 5 days. 02/12/21 02/17/21 Yes Louretta Parma, DO  nystatin cream (MYCOSTATIN) Apply to affected area 2 times daily 02/12/21  Yes Louretta Parma, DO  hydrOXYzine  (ATARAX/VISTARIL) 25 MG tablet Take 1 tablet (25 mg total) by mouth every 6 (six) hours as needed for itching. 03/31/17   Wojeck, Hinton Dyer, NP  ibuprofen (ADVIL,MOTRIN) 600 MG tablet Take 1 tablet (600 mg total) by mouth every 8 (eight) hours as needed. 12/12/15   Pricilla Loveless, MD  methocarbamol (ROBAXIN) 500 MG tablet Take 1-2 tablets (500-1,000 mg total) by mouth every 6 (six) hours as needed for muscle spasms (and pain). 08/14/15   Trixie Dredge, PA-C  oxyCODONE-acetaminophen (PERCOCET) 5-325 MG tablet Take 1-2 tablets by mouth every 6 (six) hours as needed for severe pain. 12/12/15   Pricilla Loveless, MD  triamcinolone cream (KENALOG) 0.1 % Apply 1 application topically 2 (two) times daily. For itch. Do not use for more than 1 week 03/31/17   Wojeck, Hinton Dyer, NP    Allergies    Patient has no known allergies.  Review of Systems   Review of Systems  Constitutional: Negative for chills and fever.  HENT: Negative for ear pain and sore throat.   Eyes: Negative for pain and visual disturbance.  Respiratory: Negative for cough and shortness of breath.   Cardiovascular: Negative for chest pain and palpitations.  Gastrointestinal: Negative for abdominal pain and vomiting.  Genitourinary: Negative for dysuria and hematuria.  Musculoskeletal: Negative for arthralgias and back pain.  Skin: Positive for rash. Negative for color change.  Neurological: Negative for seizures and syncope.  All other systems reviewed and are negative.   Physical Exam Updated Vital Signs BP 122/86 (BP Location: Right Arm)   Pulse 68   Temp 97.8 F (36.6 C) (Oral)   Resp 18   SpO2 99%   Physical Exam Vitals and nursing note reviewed.  Constitutional:      Appearance: He is well-developed.  HENT:     Head: Normocephalic and atraumatic.  Eyes:     Conjunctiva/sclera: Conjunctivae normal.  Cardiovascular:     Rate and Rhythm: Normal rate and regular rhythm.     Heart sounds: No murmur heard.   Pulmonary:      Effort: Pulmonary effort is normal. No respiratory distress.     Breath sounds: Normal breath sounds.  Abdominal:     Palpations: Abdomen is soft.     Tenderness: There is no abdominal tenderness.  Musculoskeletal:        General: Normal range of motion.     Cervical back: Neck supple.  Skin:    General: Skin is warm and dry.     Capillary Refill: Capillary refill takes less than 2 seconds.     Comments: Blistering maculopapular rash with overlying warmth on lower abdomen, surrounded with excoriation marks.  No fluctuance, erythema, pus.  Mildly indurated.  Neurological:     General: No focal deficit present.     Mental Status: He is alert and oriented to person, place, and time.  Psychiatric:        Mood and Affect: Mood normal.        Behavior: Behavior normal.     ED Results / Procedures / Treatments   Labs (all labs ordered are listed, but only abnormal results are displayed) Labs Reviewed - No data to display  EKG None  Radiology No results found.  Procedures Procedures   Medications Ordered in ED Medications  diphenhydrAMINE (BENADRYL) capsule 25 mg (25 mg Oral Given 02/12/21 1120)  ibuprofen (ADVIL) tablet 600 mg (600 mg Oral Given 02/12/21 1120)  barrier cream (non-specified) 1 application (1 application Topical Given 02/12/21 1210)    ED Course  I have reviewed the triage vital signs and the nursing notes.  Pertinent labs & imaging results that were available during my care of the patient were reviewed by me and considered in my medical decision making (see chart for details).    MDM Rules/Calculators/A&P                          50 year old male presents emergency department for rash.  Vital signs are stable, not in acute distress.  Exam shows rash to lower abdomen.  Consistent with contact dermatitis, possibly developing overlying cellulitis.  Not consistent with zoster, disseminated herpes, or abscess.  I discussed findings with patient along with most  likely diagnosis.  Recommended not using the belt as it has caused problems before, suspect nickel allergy.  Was given Benadryl and ibuprofen in the emergency department.  Barrier cream applied.  Patient stable for discharge.  We discussed symptomatic management and return precautions.  Will start Keflex due to concern for superimposed cellulitis.  Recommended using antifungal barrier cream.  Recommended PCP follow-up closely.  Final Clinical Impression(s) / ED Diagnoses Final diagnoses:  Allergic contact dermatitis due to metals    Rx / DC Orders ED Discharge Orders         Ordered  cephALEXin (KEFLEX) 500 MG capsule  4 times daily        02/12/21 1127    Zinc Oxide (TRIPLE PASTE) 12.8 % ointment  As needed,   Status:  Discontinued        02/12/21 1127    nystatin cream (MYCOSTATIN)        02/12/21 1133           Louretta Parma, DO 02/12/21 1323    Arby Barrette, MD 02/16/21 1505

## 2021-03-20 ENCOUNTER — Emergency Department (HOSPITAL_COMMUNITY)
Admission: EM | Admit: 2021-03-20 | Discharge: 2021-03-20 | Disposition: A | Discharge status: Home / Self Care | Payer: Self-pay | Attending: Emergency Medicine | Admitting: Emergency Medicine

## 2021-03-20 ENCOUNTER — Other Ambulatory Visit: Payer: Self-pay

## 2021-03-20 DIAGNOSIS — Z5321 Procedure and treatment not carried out due to patient leaving prior to being seen by health care provider: Secondary | ICD-10-CM | POA: Insufficient documentation

## 2021-03-20 DIAGNOSIS — R21 Rash and other nonspecific skin eruption: Secondary | ICD-10-CM | POA: Insufficient documentation

## 2021-03-20 NOTE — ED Notes (Signed)
Pt left ED. Pt asked for the number and address for the [facility] and [facility]. This tech gave him the information, and he stated he planned to go there.

## 2021-03-20 NOTE — ED Notes (Signed)
No answer x 2 for triage.

## 2021-03-20 NOTE — ED Triage Notes (Signed)
Pt reports rash on abdomen since mid-May. Seen for same and given rx cream which provided some relief but has run out of the cream.

## 2021-03-23 ENCOUNTER — Encounter: Payer: Self-pay | Admitting: Emergency Medicine

## 2021-03-23 ENCOUNTER — Other Ambulatory Visit: Payer: Self-pay

## 2021-03-23 ENCOUNTER — Ambulatory Visit
Admission: EM | Admit: 2021-03-23 | Discharge: 2021-03-23 | Disposition: A | Discharge status: Home / Self Care | Payer: Self-pay | Attending: Emergency Medicine | Admitting: Emergency Medicine

## 2021-03-23 DIAGNOSIS — R21 Rash and other nonspecific skin eruption: Secondary | ICD-10-CM

## 2021-03-23 MED ORDER — DOXYCYCLINE HYCLATE 100 MG PO CAPS: 100.0000 mg | ORAL_CAPSULE | Freq: Two times a day (BID) | ORAL | 0 refills | Status: AC

## 2021-03-23 MED ORDER — PREDNISONE 10 MG PO TABS: ORAL_TABLET | ORAL | 0 refills | Status: DC

## 2021-03-23 MED ORDER — TRIAMCINOLONE ACETONIDE 0.1 % EX CREA: 1.0000 "application " | TOPICAL_CREAM | Freq: Two times a day (BID) | CUTANEOUS | 0 refills | Status: DC
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
## 2021-03-23 NOTE — Discharge Instructions (Addendum)
Begin doxycycline twice daily for 1 week Begin prednisone taper x6 days-begin with 6 tablets on day 1, decrease by 1 tablet each day until complete-6, 5, 4, 3, 2, 1-take with food and earlier in the day if possible Use triamcinolone cream twice daily to area as well Continue moisturization Please follow-up in 3 to 4 days if not seeing any improvement with the rash with the above

## 2021-03-23 NOTE — ED Provider Notes (Signed)
[name]    CSN: [id number] Arrival date & time: 03/23/21  0840      History   Chief Complaint Chief Complaint  Patient presents with   Rash    HPI QUINTEN ALLERTON is a 50 y.o. male presenting today for evaluation of a rash.  Reports rash to abdomen x2 to 3 weeks.  Reports initially rash spontaneously resolved, but then returned again recently.  Reports associated pain and itching with area.  Unsure if related to wearing a belt recently.  He denies any other new hygiene products.  Denies history of similar.  Has been slightly oozing.  Denies fevers.  HPI  Past Medical History:  Diagnosis Date   Arthritis     Patient Active Problem List   Diagnosis Date Noted   Auditory hallucinations    Depression    Homicidal ideation    Polysubstance abuse (HCC)    Suicidal ideation    Substance induced mood disorder (HCC)     History reviewed. No pertinent surgical history.     Home Medications    Prior to Admission medications   Medication Sig Start Date End Date Taking? Authorizing Provider  doxycycline (VIBRAMYCIN) 100 MG capsule Take 1 capsule (100 mg total) by mouth 2 (two) times daily for 7 days. 03/23/21 03/30/21 Yes Trampus Mcquerry C, PA-C  predniSONE (DELTASONE) 10 MG tablet Begin with 6 tabs on day 1, 5 tab on day 2, 4 tab on day 3, 3 tab on day 4, 2 tab on day 5, 1 tab on day 6-take with food 03/23/21  Yes Yahshua Thibault C, PA-C  triamcinolone cream (KENALOG) 0.1 % Apply 1 application topically 2 (two) times daily. 03/23/21  Yes Dorreen Valiente, Junius Creamer, PA-C    Family History History reviewed. No pertinent family history.  Social History Social History   Tobacco Use   Smoking status: Every Day    Packs/day: 0.50    Years: 10.00    Pack years: 5.00    Types: Cigarettes   Smokeless tobacco: Never  Substance Use Topics   Alcohol use: Yes    Comment: former   Drug use: Yes    Frequency: 3.0 times per week    Types: Marijuana, Cocaine    Comment:  fomer     Allergies   Patient has no known allergies.   Review of Systems Review of Systems  Constitutional:  Negative for fatigue and fever.  Eyes:  Negative for redness, itching and visual disturbance.  Respiratory:  Negative for shortness of breath.   Cardiovascular:  Negative for chest pain and leg swelling.  Gastrointestinal:  Negative for nausea and vomiting.  Musculoskeletal:  Negative for arthralgias and myalgias.  Skin:  Positive for color change and rash. Negative for wound.  Neurological:  Negative for dizziness, syncope, weakness, light-headedness and headaches.    Physical Exam Triage Vital Signs ED Triage Vitals  Enc Vitals Group     BP 03/23/21 0941 (!) 152/78     Pulse Rate 03/23/21 0941 90     Resp 03/23/21 0941 15     Temp 03/23/21 0941 98 F (36.7 C)     Temp Source 03/23/21 0941 Oral     SpO2 03/23/21 0941 95 %     Weight --      Height --      Head Circumference --      Peak Flow --      Pain Score 03/23/21 0939 10     Pain Loc --  Pain Edu? --      Excl. in GC? --    No data found.  Updated Vital Signs BP (!) 152/78 (BP Location: Left Arm)   Pulse 90   Temp 98 F (36.7 C) (Oral)   Resp 15   SpO2 95%   Visual Acuity Right Eye Distance:   Left Eye Distance:   Bilateral Distance:    Right Eye Near:   Left Eye Near:    Bilateral Near:     Physical Exam Vitals and nursing note reviewed.  Constitutional:      Appearance: He is well-developed.     Comments: No acute distress  HENT:     Head: Normocephalic and atraumatic.     Nose: Nose normal.  Eyes:     Conjunctiva/sclera: Conjunctivae normal.  Cardiovascular:     Rate and Rhythm: Normal rate.  Pulmonary:     Effort: Pulmonary effort is normal. No respiratory distress.  Abdominal:     General: There is no distension.  Musculoskeletal:        General: Normal range of motion.     Cervical back: Neck supple.  Skin:    General: Skin is warm and dry.     Comments: See  picture below-lower abdomen with area of hyperpigmentation and drying surrounded by small erythematous papules, does not extend into pubic area  Neurological:     Mental Status: He is alert and oriented to person, place, and time.      UC Treatments / Results  Labs (all labs ordered are listed, but only abnormal results are displayed) Labs Reviewed - No data to display  EKG   Radiology No results found.  Procedures Procedures (including critical care time)  Medications Ordered in UC Medications - No data to display  Initial Impression / Assessment and Plan / UC Course  I have reviewed the triage vital signs and the nursing notes.  Pertinent labs & imaging results that were available during my care of the patient were reviewed by me and considered in my medical decision making (see chart for details).     Rash appearance slightly suggestive of eczema/inflammatory cause-providing prednisone taper along with triamcinolone topically, given patient's reported oozing and pain did opt to go ahead and cover with doxycycline for infectious etiology.  Discussed strict return precautions. Patient verbalized understanding and is agreeable with plan.  Final Clinical Impressions(s) / UC Diagnoses   Final diagnoses:  Rash and nonspecific skin eruption     Discharge Instructions      Begin doxycycline twice daily for 1 week Begin prednisone taper x6 days-begin with 6 tablets on day 1, decrease by 1 tablet each day until complete-6, 5, 4, 3, 2, 1-take with food and earlier in the day if possible Use triamcinolone cream twice daily to area as well Continue moisturization Please follow-up in 3 to 4 days if not seeing any improvement with the rash with the above     ED Prescriptions     Medication Sig Dispense Auth. Provider   predniSONE (DELTASONE) 10 MG tablet Begin with 6 tabs on day 1, 5 tab on day 2, 4 tab on day 3, 3 tab on day 4, 2 tab on day 5, 1 tab on day 6-take with  food 21 tablet Jodiann Ognibene C, PA-C   triamcinolone cream (KENALOG) 0.1 % Apply 1 application topically 2 (two) times daily. 80 g Lacey Dotson C, PA-C   doxycycline (VIBRAMYCIN) 100 MG capsule Take 1 capsule (100 mg total)  by mouth 2 (two) times daily for 7 days. 14 capsule Verenice Westrich, [place] C, PA-C      PDMP not reviewed this encounter.   Lew Dawes, PA-C 03/23/21 1125

## 2021-09-19 ENCOUNTER — Ambulatory Visit: Payer: Self-pay

## 2021-09-21 ENCOUNTER — Other Ambulatory Visit: Payer: Self-pay

## 2021-09-21 ENCOUNTER — Ambulatory Visit
Admission: EM | Admit: 2021-09-21 | Discharge: 2021-09-21 | Disposition: A | Discharge status: Home / Self Care | Payer: Self-pay | Attending: Physician Assistant | Admitting: Physician Assistant

## 2021-09-21 DIAGNOSIS — L259 Unspecified contact dermatitis, unspecified cause: Secondary | ICD-10-CM

## 2021-09-21 MED ORDER — DOXYCYCLINE HYCLATE 100 MG PO CAPS: 100.0000 mg | ORAL_CAPSULE | Freq: Two times a day (BID) | ORAL | 0 refills | Status: DC

## 2021-09-21 MED ORDER — PREDNISONE 10 MG (21) PO TBPK: ORAL_TABLET | Freq: Every day | ORAL | 0 refills | Status: DC

## 2021-09-21 MED ORDER — TRIAMCINOLONE ACETONIDE 0.1 % EX CREA: 1.0000 "application " | TOPICAL_CREAM | Freq: Two times a day (BID) | CUTANEOUS | 0 refills | Status: AC

## 2021-09-21 NOTE — ED Provider Notes (Signed)
[name]    CSN: [id number] Arrival date & time: 09/21/21  0935      History   Chief Complaint Chief Complaint  Patient presents with   Rash    HPI Donald Bentley is a 50 y.o. male.   Patient here today for recurrent rash to his lower abdomen that some present for 3 weeks.  He states that he was seen for same in the summer and rash resolved with treatment.  He reports rash seems somewhat worse this time as it is oozing any areas.  Rash is painful.  He has tried Eucerin without significant relief.  He denies any fever.  He has not any nausea or vomiting.  The history is provided by the patient.  Rash Associated symptoms: no fever and no shortness of breath    Past Medical History:  Diagnosis Date   Arthritis     Patient Active Problem List   Diagnosis Date Noted   Auditory hallucinations    Depression    Homicidal ideation    Polysubstance abuse (HCC)    Suicidal ideation    Substance induced mood disorder (HCC)     History reviewed. No pertinent surgical history.     Home Medications    Prior to Admission medications   Medication Sig Start Date End Date Taking? Authorizing Provider  doxycycline (VIBRAMYCIN) 100 MG capsule Take 1 capsule (100 mg total) by mouth 2 (two) times daily. 09/21/21  Yes Tomi Bamberger, PA-C  predniSONE (STERAPRED UNI-PAK 21 TAB) 10 MG (21) TBPK tablet Take by mouth daily. Take 6 tabs by mouth daily  for 2 days, then 5 tabs for 2 days, then 4 tabs for 2 days, then 3 tabs for 2 days, 2 tabs for 2 days, then 1 tab by mouth daily for 2 days 09/21/21  Yes Tomi Bamberger, PA-C  triamcinolone cream (KENALOG) 0.1 % Apply 1 application topically 2 (two) times daily. 09/21/21  Yes Tomi Bamberger, PA-C    Family History History reviewed. No pertinent family history.  Social History Social History   Tobacco Use   Smoking status: Every Day    Packs/day: 0.50    Years: 10.00    Pack years: 5.00    Types: Cigarettes    Smokeless tobacco: Never  Substance Use Topics   Alcohol use: Yes    Comment: former   Drug use: Yes    Frequency: 3.0 times per week    Types: Marijuana, Cocaine    Comment: fomer     Allergies   Patient has no known allergies.   Review of Systems Review of Systems  Constitutional:  Negative for chills and fever.  Eyes:  Negative for discharge.  Respiratory:  Negative for shortness of breath.   Skin:  Positive for rash.    Physical Exam Triage Vital Signs ED Triage Vitals  Enc Vitals Group     BP 09/21/21 1111 129/83     Pulse Rate 09/21/21 1111 89     Resp 09/21/21 1111 16     Temp 09/21/21 1111 98 F (36.7 C)     Temp Source 09/21/21 1111 Oral     SpO2 09/21/21 1111 98 %     Weight --      Height --      Head Circumference --      Peak Flow --      Pain Score 09/21/21 1110 10     Pain Loc --  Pain Edu? --      Excl. in GC? --    No data found.  Updated Vital Signs BP 129/83 (BP Location: Left Arm)    Pulse 89    Temp 98 F (36.7 C) (Oral)    Resp 16    SpO2 98%      Physical Exam Vitals and nursing note reviewed.  Constitutional:      General: He is not in acute distress.    Appearance: Normal appearance. He is not ill-appearing.  HENT:     Head: Normocephalic and atraumatic.  Eyes:     Conjunctiva/sclera: Conjunctivae normal.  Cardiovascular:     Rate and Rhythm: Normal rate.  Pulmonary:     Effort: Pulmonary effort is normal.  Skin:    Comments: Dry appearing hyperpigmented confluent rash to lower abdomen diffusely with few satellite papular lesions.  Some oozing noted to areas where skin is broken.  Neurological:     Mental Status: He is alert.     UC Treatments / Results  Labs (all labs ordered are listed, but only abnormal results are displayed) Labs Reviewed - No data to display  EKG   Radiology No results found.  Procedures Procedures (including critical care time)  Medications Ordered in UC Medications - No data to  display  Initial Impression / Assessment and Plan / UC Course  I have reviewed the triage vital signs and the nursing notes.  Pertinent labs & imaging results that were available during my care of the patient were reviewed by me and considered in my medical decision making (see chart for details).    Rash appears consistent with contact dermatitis.  Will treat with similar regimen from the summer as a seem to be effective.  Recommend follow-up in ED with any worsening.  Final Clinical Impressions(s) / UC Diagnoses   Final diagnoses:  Contact dermatitis, unspecified contact dermatitis type, unspecified trigger   Discharge Instructions   None    ED Prescriptions     Medication Sig Dispense Auth. Provider   predniSONE (STERAPRED UNI-PAK 21 TAB) 10 MG (21) TBPK tablet Take by mouth daily. Take 6 tabs by mouth daily  for 2 days, then 5 tabs for 2 days, then 4 tabs for 2 days, then 3 tabs for 2 days, 2 tabs for 2 days, then 1 tab by mouth daily for 2 days 42 tablet Erma Pinto F, PA-C   doxycycline (VIBRAMYCIN) 100 MG capsule Take 1 capsule (100 mg total) by mouth 2 (two) times daily. 20 capsule Erma Pinto F, PA-C   triamcinolone cream (KENALOG) 0.1 % Apply 1 application topically 2 (two) times daily. 30 g Tomi Bamberger, PA-C      PDMP not reviewed this encounter.   Tomi Bamberger, PA-C 09/21/21 1147

## 2021-09-21 NOTE — ED Triage Notes (Signed)
Patient presents to Urgent Care with complaints of a rash with drainage located on abdominal area x 3 weeks ago. Has been applying Eucirin cream with no improvement.  Denies fever.

## 2022-05-06 ENCOUNTER — Encounter: Payer: Self-pay | Admitting: *Deleted

## 2022-05-06 ENCOUNTER — Ambulatory Visit
Admission: EM | Admit: 2022-05-06 | Discharge: 2022-05-06 | Disposition: A | Discharge status: Home / Self Care | Payer: Self-pay | Attending: Internal Medicine | Admitting: Internal Medicine

## 2022-05-06 DIAGNOSIS — R21 Rash and other nonspecific skin eruption: Secondary | ICD-10-CM

## 2022-05-06 MED ORDER — DOXYCYCLINE HYCLATE 100 MG PO CAPS: 100.0000 mg | ORAL_CAPSULE | Freq: Two times a day (BID) | ORAL | 0 refills | Status: AC

## 2022-05-06 MED ORDER — PREDNISONE 10 MG (21) PO TBPK: ORAL_TABLET | Freq: Every day | ORAL | 0 refills | Status: AC

## 2022-05-06 NOTE — ED Triage Notes (Signed)
Pt reports the same rash as he normally gets - has documented hx contact dermatitis. States he started with same rash to bilat arms, and a little bit to torso approx 3 wks ago. Has not been applying any topical treatments.

## 2022-05-06 NOTE — ED Provider Notes (Signed)
[name]    CSN: [id number] Arrival date & time: 05/06/22  1433      History   Chief Complaint Chief Complaint  Patient presents with   Rash    HPI Donald Bentley is a 51 y.o. male.   Patient presents with itchy rash to bilateral arms, abdomen, and posterior neck that has been present for about 3 weeks.  Patient reports that he has a history of recurrent dermatitis and is typically treated with antibiotics and steroids with resolution.  He states that he has never seen a dermatologist for this.  Denies any changes in environment including lotions, soaps detergents, foods, etc.  Denies feeling of throat closing or shortness of breath.   Rash   Past Medical History:  Diagnosis Date   Arthritis    Contact dermatitis     Patient Active Problem List   Diagnosis Date Noted   Auditory hallucinations    Depression    Homicidal ideation    Polysubstance abuse (HCC)    Suicidal ideation    Substance induced mood disorder (HCC)     Past Surgical History:  Procedure Laterality Date   HERNIA REPAIR         Home Medications    Prior to Admission medications   Medication Sig Start Date End Date Taking? Authorizing Provider  doxycycline (VIBRAMYCIN) 100 MG capsule Take 1 capsule (100 mg total) by mouth 2 (two) times daily. 05/06/22  Yes Yassir Enis, Rolly Salter E, FNP  predniSONE (STERAPRED UNI-PAK 21 TAB) 10 MG (21) TBPK tablet Take by mouth daily. Take 6 tabs by mouth daily  for 2 days, then 5 tabs for 2 days, then 4 tabs for 2 days, then 3 tabs for 2 days, 2 tabs for 2 days, then 1 tab by mouth daily for 2 days 05/06/22  Yes Devota Viruet, [place] E, FNP  triamcinolone cream (KENALOG) 0.1 % Apply 1 application topically 2 (two) times daily. 09/21/21   Tomi Bamberger, PA-C    Family History Family History  Problem Relation Age of Onset   Healthy Mother     Social History Social History   Tobacco Use   Smoking status: Every Day    Packs/day: 0.50    Years: 10.00     Total pack years: 5.00    Types: Cigarettes   Smokeless tobacco: Never  Vaping Use   Vaping Use: Never used  Substance Use Topics   Alcohol use: Yes    Comment: former   Drug use: Not Currently    Types: Marijuana, Cocaine    Comment: former     Allergies   Patient has no known allergies.   Review of Systems Review of Systems Per HPI  Physical Exam Triage Vital Signs ED Triage Vitals  Enc Vitals Group     BP 05/06/22 1644 (!) 147/83     Pulse Rate 05/06/22 1644 89     Resp 05/06/22 1644 16     Temp 05/06/22 1644 97.7 F (36.5 C)     Temp Source 05/06/22 1644 Oral     SpO2 05/06/22 1644 97 %     Weight --      Height --      Head Circumference --      Peak Flow --      Pain Score 05/06/22 1646 0     Pain Loc --      Pain Edu? --      Excl. in GC? --    No  data found.  Updated Vital Signs BP (!) 147/83   Pulse 89   Temp 97.7 F (36.5 C) (Oral)   Resp 16   SpO2 97%   Visual Acuity Right Eye Distance:   Left Eye Distance:   Bilateral Distance:    Right Eye Near:   Left Eye Near:    Bilateral Near:     Physical Exam Constitutional:      General: He is not in acute distress.    Appearance: Normal appearance. He is not toxic-appearing or diaphoretic.  HENT:     Head: Normocephalic and atraumatic.  Eyes:     Extraocular Movements: Extraocular movements intact.     Conjunctiva/sclera: Conjunctivae normal.  Pulmonary:     Effort: Pulmonary effort is normal.  Skin:    Comments: Diffuse papular flesh-colored rash present to posterior neck, lower abdomen, bilateral upper extremities.  There is mild clear drainage present to left antecubital space.  Neurological:     General: No focal deficit present.     Mental Status: He is alert and oriented to person, place, and time. Mental status is at baseline.  Psychiatric:        Mood and Affect: Mood normal.        Behavior: Behavior normal.        Thought Content: Thought content normal.        Judgment:  Judgment normal.      UC Treatments / Results  Labs (all labs ordered are listed, but only abnormal results are displayed) Labs Reviewed - No data to display  EKG   Radiology No results found.  Procedures Procedures (including critical care time)  Medications Ordered in UC Medications - No data to display  Initial Impression / Assessment and Plan / UC Course  I have reviewed the triage vital signs and the nursing notes.  Pertinent labs & imaging results that were available during my care of the patient were reviewed by me and considered in my medical decision making (see chart for details).     After further review of patient's chart, it appears the patient has a recurrent issue with contact dermatitis.  Although, there has been concern before for cellulitis so he is typically treated with antibiotics and steroids.  There is some mild drainage located to one area of the rash on the arm so will opt to treat with doxycycline as well.  Prednisone steroid taper also prescribed for patient.  Advised patient that it may be beneficial to follow-up with dermatology given that this is a current issue and patient was provided with contact information.  Patient given strict return precautions.  Patient verbalized understanding and was agreeable with plan. Final Clinical Impressions(s) / UC Diagnoses   Final diagnoses:  Rash and nonspecific skin eruption     Discharge Instructions      You have been prescribed prednisone steroid and an antibiotic to alleviate rash.  Please follow-up with dermatology for further evaluation and management given that this is a recurrent issue.    ED Prescriptions     Medication Sig Dispense Auth. Provider   doxycycline (VIBRAMYCIN) 100 MG capsule Take 1 capsule (100 mg total) by mouth 2 (two) times daily. 20 capsule Kinsman, [place] E, [place]   predniSONE (STERAPRED UNI-PAK 21 TAB) 10 MG (21) TBPK tablet Take by mouth daily. Take 6 tabs by mouth daily  for  2 days, then 5 tabs for 2 days, then 4 tabs for 2 days, then 3 tabs for 2 days,  2 tabs for 2 days, then 1 tab by mouth daily for 2 days 42 tablet [place], Acie Fredrickson, [place]      PDMP not reviewed this encounter.   Gustavus Bryant, [place] 05/06/22 1744

## 2022-05-06 NOTE — Discharge Instructions (Addendum)
You have been prescribed prednisone steroid and an antibiotic to alleviate rash.  Please follow-up with dermatology for further evaluation and management given that this is a recurrent issue.
